# Patient Record
Sex: Female | Born: 1940 | Race: White | Hispanic: No | Smoking: Former smoker
Health system: Southern US, Community
[De-identification: ages and names within clinical notes are randomized; demographics above are authoritative.]

## PROBLEM LIST (undated history)

## (undated) DIAGNOSIS — I10 Essential (primary) hypertension: Secondary | ICD-10-CM

## (undated) DIAGNOSIS — E785 Hyperlipidemia, unspecified: Secondary | ICD-10-CM

## (undated) DIAGNOSIS — M1990 Unspecified osteoarthritis, unspecified site: Secondary | ICD-10-CM

## (undated) DIAGNOSIS — I6529 Occlusion and stenosis of unspecified carotid artery: Secondary | ICD-10-CM

## (undated) DIAGNOSIS — C801 Malignant (primary) neoplasm, unspecified: Secondary | ICD-10-CM

## (undated) DIAGNOSIS — J189 Pneumonia, unspecified organism: Secondary | ICD-10-CM

## (undated) DIAGNOSIS — N189 Chronic kidney disease, unspecified: Secondary | ICD-10-CM

## (undated) DIAGNOSIS — E039 Hypothyroidism, unspecified: Secondary | ICD-10-CM

## (undated) HISTORY — PX: CHOLECYSTECTOMY: SHX55

## (undated) HISTORY — PX: COLON SURGERY: SHX602

## (undated) HISTORY — PX: EYE SURGERY: SHX253

---

## 2009-05-24 ENCOUNTER — Emergency Department (HOSPITAL_COMMUNITY)
Admission: EM | Admit: 2009-05-24 | Discharge: 2009-05-24 | Payer: Self-pay | Source: Home / Self Care | Admitting: Emergency Medicine

## 2010-07-20 LAB — COMPREHENSIVE METABOLIC PANEL
Alkaline Phosphatase: 96 U/L (ref 39–117)
BUN: 28 mg/dL — ABNORMAL HIGH (ref 6–23)
Calcium: 9.3 mg/dL (ref 8.4–10.5)
GFR calc Af Amer: 47 mL/min — ABNORMAL LOW (ref >60)
Glucose, Bld: 122 mg/dL — ABNORMAL HIGH (ref 70–99)
Total Protein: 7 g/dL (ref 6.0–8.3)

## 2010-07-20 LAB — DIFFERENTIAL
Basophils Absolute: 0 10*3/uL (ref 0.0–0.1)
Basophils Relative: 0 % (ref 0–1)
Eosinophils Absolute: 0.1 10*3/uL (ref 0.0–0.7)
Eosinophils Relative: 1 % (ref 0–5)
Monocytes Absolute: 0.8 10*3/uL (ref 0.1–1.0)

## 2010-07-20 LAB — CBC
HCT: 40.5 % (ref 36.0–46.0)
Hemoglobin: 13.7 g/dL (ref 12.0–15.0)
MCHC: 33.8 g/dL (ref 30.0–36.0)
RDW: 12.8 % (ref 11.5–15.5)

## 2020-08-29 ENCOUNTER — Encounter (HOSPITAL_COMMUNITY): Payer: Self-pay

## 2020-08-29 NOTE — Progress Notes (Addendum)
PCP - Clearance Dr. Meredith Mody ,[id number] on chart Cardiologist - 08-19-20 clearance on chart Dr. Collier Salina o'Brien lov 06-20-20 on chart  PPM/ICD -  Device Orders -  Rep Notified -   Chest x-ray -  EKG - 03-24-20 on chart Stress Test -  ECHO -  Cardiac Cath -   Sleep Study -  CPAP -   Fasting Blood Sugar -  Checks Blood Sugar _____ times a day  Blood Thinner Instructions: Aspirin Instructions:81 mg hold 5-7 days  ERAS Protcol - PRE-SURGERY Ensure or G2-   COVID TEST- 09-01-20 Fully vaccinated modern[employer] Activity-Able to walk to mailbox without SOB and walks a flight of stairs without SOB. Owns own business   Anesthesia review: HTN  Patient denies shortness of breath, fever, cough and chest pain at PAT appointment   All instructions explained to the patient, with a verbal understanding of the material. Patient agrees to go over the instructions while at home for a better understanding. Patient also instructed to self quarantine after being tested for COVID-19. The opportunity to ask questions was provided.

## 2020-08-29 NOTE — Patient Instructions (Signed)
DUE TO COVID-19 ONLY ONE VISITOR IS ALLOWED TO COME WITH YOU AND STAY IN THE WAITING ROOM ONLY DURING PRE OP AND PROCEDURE DAY OF SURGERY.   TWO  VISITOR  MAY VISIT WITH YOU AFTER SURGERY IN YOUR PRIVATE ROOM DURING VISITING HOURS ONLY!  YOU NEED TO HAVE A COVID 19 TEST ON_5-2______ @_______ , THIS TEST MUST BE DONE BEFORE SURGERY,  COVID TESTING SITE [address] [place] [address], IT IS ON THE RIGHT GOING OUT [address] APPROXIMATELY  2 MINUTES PAST [employer] ON THE RIGHT. ONCE YOUR COVID TEST IS COMPLETED,  PLEASE BEGIN THE QUARANTINE INSTRUCTIONS AS OUTLINED IN YOUR HANDOUT.                KAROLINA OCEAN  08/29/2020   Your procedure is scheduled on: 09-03-20   Report to [facility] Main  Entrance   Report to admitting at     [place]  AM     Call this number if you have problems the morning of surgery [phone]    Remember: NO SOLID FOOD AFTER MIDNIGHT THE NIGHT PRIOR TO SURGERY. NOTHING BY MOUTH EXCEPT CLEAR LIQUIDS UNTIL    0700 am .   PLEASE FINISH ENSURE DRINK PER SURGEON ORDER  WHICH NEEDS TO BE COMPLETED AT       0700 am the nothing by mouth .    CLEAR LIQUID DIET   Foods Allowed                                                                                  Foods Excluded  Black Coffee and tea, regular and decaf                                          liquids that you cannot  Plain Jell-O any favor except red or purple                                           see through such as: Fruit ices (not with fruit pulp)                                                   milk, soups, orange juice  Iced Popsicles                                                     All solid food Carbonated beverages, regular and diet                                    Cranberry, grape and apple juices  Sports drinks like Gatorade Lightly seasoned clear broth or consume(fat free) Sugar, honey  syrup  _____________________________________________________________________     BRUSH YOUR TEETH MORNING OF SURGERY AND RINSE YOUR MOUTH OUT, NO CHEWING GUM CANDY OR MINTS.     Take these medicines the morning of surgery with A SIP OF WATER: metoprolol, simvastatin, zetia  DO NOT TAKE ANY DIABETIC MEDICATIONS DAY OF YOUR SURGERY                               You may not have any metal on your body including hair pins and              piercings  Do not wear jewelry, make-up, lotions, powders or perfumes, deodorant             Do not wear nail polish on your fingernails.  Do not shave  48 hours prior to surgery.     Do not bring valuables to the hospital. [place].  Contacts, dentures or bridgework may not be worn into surgery. .     Patients discharged the day of surgery will not be allowed to drive home. IF YOU ARE HAVING SURGERY AND GOING HOME THE SAME DAY, YOU MUST HAVE AN ADULT TO DRIVE YOU HOME AND BE WITH YOU FOR 24 HOURS. YOU MAY GO HOME BY TAXI OR UBER OR ORTHERWISE, BUT AN ADULT MUST ACCOMPANY YOU HOME AND STAY WITH YOU FOR 24 HOURS.  Name and phone number of your driver:  Special Instructions: N/A              Please read over the following fact sheets you were given: _____________________________________________________________________             [facility] - Preparing for Surgery Before surgery, you can play an important role.  Because skin is not sterile, your skin needs to be as free of germs as possible.  You can reduce the number of germs on your skin by washing with CHG (chlorahexidine gluconate) soap before surgery.  CHG is an antiseptic cleaner which kills germs and bonds with the skin to continue killing germs even after washing. Please DO NOT use if you have an allergy to CHG or antibacterial soaps.  If your skin becomes reddened/irritated stop using the CHG and inform your nurse when you arrive at Short  Stay. Do not shave (including legs and underarms) for at least 48 hours prior to the first CHG shower.  You may shave your face/neck. Please follow these instructions carefully:  1.  Shower with CHG Soap the night before surgery and the  morning of Surgery.  2.  If you choose to wash your hair, wash your hair first as usual with your  normal  shampoo.  3.  After you shampoo, rinse your hair and body thoroughly to remove the  shampoo.                           4.  Use CHG as you would any other liquid soap.  You can apply chg directly  to the skin and wash                       Gently with a scrungie or clean washcloth.  5.  Apply  the CHG Soap to your body ONLY FROM THE NECK DOWN.   Do not use on face/ open                           Wound or open sores. Avoid contact with eyes, ears mouth and genitals (private parts).                       Wash face,  Genitals (private parts) with your normal soap.             6.  Wash thoroughly, paying special attention to the area where your surgery  will be performed.  7.  Thoroughly rinse your body with warm water from the neck down.  8.  DO NOT shower/wash with your normal soap after using and rinsing off  the CHG Soap.                9.  Pat yourself dry with a clean towel.            10.  Wear clean pajamas.            11.  Place clean sheets on your bed the night of your first shower and do not  sleep with pets. Day of Surgery : Do not apply any lotions/deodorants the morning of surgery.  Please wear clean clothes to the hospital/surgery center.  FAILURE TO FOLLOW THESE INSTRUCTIONS MAY RESULT IN THE CANCELLATION OF YOUR SURGERY PATIENT SIGNATURE_________________________________  NURSE SIGNATURE__________________________________  ________________________________________________________________________   Adam Phenix  An incentive spirometer is a tool that can help keep your lungs clear and active. This tool measures how well you are  filling your lungs with each breath. Taking long deep breaths may help reverse or decrease the chance of developing breathing (pulmonary) problems (especially infection) following:  A long period of time when you are unable to move or be active. BEFORE THE PROCEDURE   If the spirometer includes an indicator to show your best effort, your nurse or respiratory therapist will set it to a desired goal.  If possible, sit up straight or lean slightly forward. Try not to slouch.  Hold the incentive spirometer in an upright position. INSTRUCTIONS FOR USE  1. Sit on the edge of your bed if possible, or sit up as far as you can in bed or on a chair. 2. Hold the incentive spirometer in an upright position. 3. Breathe out normally. 4. Place the mouthpiece in your mouth and seal your lips tightly around it. 5. Breathe in slowly and as deeply as possible, raising the piston or the ball toward the top of the column. 6. Hold your breath for 3-5 seconds or for as long as possible. Allow the piston or ball to fall to the bottom of the column. 7. Remove the mouthpiece from your mouth and breathe out normally. 8. Rest for a few seconds and repeat Steps 1 through 7 at least 10 times every 1-2 hours when you are awake. Take your time and take a few normal breaths between deep breaths. 9. The spirometer may include an indicator to show your best effort. Use the indicator as a goal to work toward during each repetition. 10. After each set of 10 deep breaths, practice coughing to be sure your lungs are clear. If you have an incision (the cut made at the time of surgery), support your incision when coughing by placing a pillow or rolled  up towels firmly against it. Once you are able to get out of bed, walk around indoors and cough well. You may stop using the incentive spirometer when instructed by your caregiver.  RISKS AND COMPLICATIONS  Take your time so you do not get dizzy or light-headed.  If you are in pain,  you may need to take or ask for pain medication before doing incentive spirometry. It is harder to take a deep breath if you are having pain. AFTER USE  Rest and breathe slowly and easily.  It can be helpful to keep track of a log of your progress. Your caregiver can provide you with a simple table to help with this. If you are using the spirometer at home, follow these instructions: [place] IF:   You are having difficultly using the spirometer.  You have trouble using the spirometer as often as instructed.  Your pain medication is not giving enough relief while using the spirometer.  You develop fever of 100.5 F (38.1 C) or higher. SEEK IMMEDIATE MEDICAL CARE IF:   You cough up bloody sputum that had not been present before.  You develop fever of 102 F (38.9 C) or greater.  You develop worsening pain at or near the incision site. MAKE SURE YOU:   Understand these instructions.  Will watch your condition.  Will get help right away if you are not doing well or get worse. Document Released: 08/30/2006 Document Revised: 07/12/2011 Document Reviewed: 10/31/2006 [facility] Patient Information 2014 [place], [place].   ________________________________________________________________________

## 2020-09-01 ENCOUNTER — Encounter (HOSPITAL_COMMUNITY): Payer: Self-pay

## 2020-09-01 ENCOUNTER — Other Ambulatory Visit: Payer: Self-pay

## 2020-09-01 ENCOUNTER — Other Ambulatory Visit (HOSPITAL_COMMUNITY)
Admission: RE | Admit: 2020-09-01 | Discharge: 2020-09-01 | Disposition: A | Discharge status: Home / Self Care | Payer: Medicare PPO | Source: Ambulatory Visit | Attending: Obstetrics and Gynecology | Admitting: Obstetrics and Gynecology

## 2020-09-01 ENCOUNTER — Encounter (HOSPITAL_COMMUNITY)
Admission: RE | Admit: 2020-09-01 | Discharge: 2020-09-01 | Disposition: A | Discharge status: Home / Self Care | Payer: Medicare PPO | Source: Ambulatory Visit | Attending: Orthopedic Surgery | Admitting: Orthopedic Surgery

## 2020-09-01 DIAGNOSIS — Z20822 Contact with and (suspected) exposure to covid-19: Secondary | ICD-10-CM | POA: Insufficient documentation

## 2020-09-01 DIAGNOSIS — Z01812 Encounter for preprocedural laboratory examination: Secondary | ICD-10-CM | POA: Diagnosis not present

## 2020-09-01 HISTORY — DX: Hyperlipidemia, unspecified: E78.5

## 2020-09-01 HISTORY — DX: Malignant (primary) neoplasm, unspecified: C80.1

## 2020-09-01 HISTORY — DX: Essential (primary) hypertension: I10

## 2020-09-01 HISTORY — DX: Hypothyroidism, unspecified: E03.9

## 2020-09-01 HISTORY — DX: Pneumonia, unspecified organism: J18.9

## 2020-09-01 HISTORY — DX: Unspecified osteoarthritis, unspecified site: M19.90

## 2020-09-01 HISTORY — DX: Occlusion and stenosis of unspecified carotid artery: I65.29

## 2020-09-01 HISTORY — DX: Chronic kidney disease, unspecified: N18.9

## 2020-09-01 LAB — COMPREHENSIVE METABOLIC PANEL
ALT: 13 U/L (ref 0–44)
AST: 21 U/L (ref 15–41)
Albumin: 3.8 g/dL (ref 3.5–5.0)
Alkaline Phosphatase: 77 U/L (ref 38–126)
Anion gap: 6 (ref 5–15)
BUN: 33 mg/dL — ABNORMAL HIGH (ref 8–23)
CO2: 25 mmol/L (ref 22–32)
Calcium: 9.5 mg/dL (ref 8.9–10.3)
Chloride: 108 mmol/L (ref 98–111)
Creatinine, Ser: 1.25 mg/dL — ABNORMAL HIGH (ref 0.44–1.00)
GFR, Estimated: 43 mL/min — ABNORMAL LOW (ref >60)
Glucose, Bld: 97 mg/dL (ref 70–99)
Potassium: 4.7 mmol/L (ref 3.5–5.1)
Sodium: 139 mmol/L (ref 135–145)
Total Bilirubin: 0.7 mg/dL (ref 0.3–1.2)
Total Protein: 6.6 g/dL (ref 6.5–8.1)

## 2020-09-01 LAB — CBC
HCT: 38.5 % (ref 36.0–46.0)
Hemoglobin: 12.4 g/dL (ref 12.0–15.0)
MCH: 29.7 pg (ref 26.0–34.0)
MCHC: 32.2 g/dL (ref 30.0–36.0)
MCV: 92.1 fL (ref 80.0–100.0)
Platelets: 228 10*3/uL (ref 150–400)
RBC: 4.18 MIL/uL (ref 3.87–5.11)
RDW: 12.9 % (ref 11.5–15.5)
WBC: 6.1 10*3/uL (ref 4.0–10.5)
nRBC: 0 % (ref 0.0–0.2)

## 2020-09-01 LAB — PROTIME-INR
INR: 0.9 (ref 0.8–1.2)
Prothrombin Time: 12.5 seconds (ref 11.4–15.2)

## 2020-09-01 LAB — SURGICAL PCR SCREEN
MRSA, PCR: NEGATIVE
Staphylococcus aureus: NEGATIVE

## 2020-09-02 LAB — SARS CORONAVIRUS 2 (TAT 6-24 HRS): SARS Coronavirus 2: NEGATIVE

## 2020-09-02 NOTE — Anesthesia Preprocedure Evaluation (Addendum)
Anesthesia Evaluation  Patient identified by MRN, date of birth, ID band Patient awake    Reviewed: Allergy & Precautions, H&P , NPO status , Patient's Chart, lab work & pertinent test results, reviewed documented beta blocker date and time   Airway Mallampati: III  TM Distance: >3 FB Neck ROM: Full    Dental no notable dental hx. (+) Edentulous Upper, Edentulous Lower, Dental Advisory Given   Pulmonary neg pulmonary ROS, former smoker,    Pulmonary exam normal breath sounds clear to auscultation       Cardiovascular Exercise Tolerance: Good hypertension, Pt. on medications and Pt. on home beta blockers  Rhythm:Regular Rate:Normal     Neuro/Psych negative neurological ROS  negative psych ROS   GI/Hepatic negative GI ROS, Neg liver ROS,   Endo/Other  Hypothyroidism   Renal/GU Renal InsufficiencyRenal disease  negative genitourinary   Musculoskeletal  (+) Arthritis , Osteoarthritis,    Abdominal   Peds  Hematology negative hematology ROS (+)   Anesthesia Other Findings   Reproductive/Obstetrics negative OB ROS                            Anesthesia Physical Anesthesia Plan  ASA: III  Anesthesia Plan: Spinal   Post-op Pain Management:    Induction: Intravenous  PONV Risk Score and Plan: 3 and Propofol infusion, Ondansetron and Dexamethasone  Airway Management Planned: Simple Face Mask  Additional Equipment:   Intra-op Plan:   Post-operative Plan:   Informed Consent: I have reviewed the patients History and Physical, chart, labs and discussed the procedure including the risks, benefits and alternatives for the proposed anesthesia with the patient or authorized representative who has indicated his/her understanding and acceptance.     Dental advisory given  Plan Discussed with: CRNA  Anesthesia Plan Comments:        Anesthesia Quick Evaluation

## 2020-09-02 NOTE — H&P (Signed)
TOTAL HIP ADMISSION H&P  Patient is admitted for right total hip arthroplasty.  Subjective:  Chief Complaint: Right hip pain  HPI: Veronica Logan, 80 y.o. female, has a history of pain and functional disability in the right hip due to arthritis and patient has failed non-surgical conservative treatments for greater than 12 weeks to include NSAID's and/or analgesics and activity modification. Onset of symptoms was gradual, starting several years ago with gradually worsening course since that time. The patient noted no past surgery on the right hip. Patient currently rates pain in the right hip at 5 out of 10 with activity. Patient has worsening of pain with activity and weight bearing and pain that interfers with activities of daily living. Patient has evidence of periarticular osteophytes and joint space narrowing by imaging studies. This condition presents safety issues increasing the risk of falls.  There is no current active infection.  There are no problems to display for this patient.   Past Medical History:  Diagnosis Date  . Arthritis   . Cancer ([place])    colon  . Carotid stenosis   . Chronic kidney disease    stage 3  . Hyperlipidemia   . Hypertension   . Hypothyroidism   . Pneumonia     Past Surgical History:  Procedure Laterality Date  . CHOLECYSTECTOMY    . COLON SURGERY     colon resection  . EYE SURGERY     cataract bil    Prior to Admission medications   Medication Sig Start Date End Date Taking? Authorizing Provider  aspirin EC 81 MG tablet Take 81 mg by mouth daily. Swallow whole.   Yes [provider]  ezetimibe (ZETIA) 10 MG tablet Take 10 mg by mouth daily.   Yes [provider]  levothyroxine (SYNTHROID) 88 MCG tablet Take 88 mcg by mouth daily.   Yes [provider]  lisinopril-hydrochlorothiazide (ZESTORETIC) 20-12.5 MG tablet Take 1 tablet by mouth daily.   Yes [provider]  metoprolol tartrate (LOPRESSOR) 25  MG tablet Take 25 mg by mouth 2 (two) times daily.   Yes [provider]  Multiple Vitamins-Minerals (OCUVITE ADULT 50+ PO) Take 1 tablet by mouth daily.   Yes [provider]  simvastatin (ZOCOR) 40 MG tablet Take 40 mg by mouth daily.   Yes [provider]    Allergies  Allergen Reactions  . Aleve [Naproxen]     Breaks mouth out     Social History   Socioeconomic History  . Marital status: Widowed    Spouse name: Not on file  . Number of children: Not on file  . Years of education: Not on file  . Highest education level: Not on file  Occupational History  . Not on file  Tobacco Use  . Smoking status: Former Smoker    Years: 10.00  . Smokeless tobacco: Never Used  . Tobacco comment: qiut over 50 years ago  Vaping Use  . Vaping Use: Never used  Substance and Sexual Activity  . Alcohol use: Not Currently  . Drug use: Never  . Sexual activity: Not Currently  Other Topics Concern  . Not on file  Social History Narrative  . Not on file   Social Determinants of Health   Financial Resource Strain: Not on file  Food Insecurity: Not on file  Transportation Needs: Not on file  Physical Activity: Not on file  Stress: Not on file  Social Connections: Not on file  Intimate Partner Violence:  Not on file    Tobacco Use: Medium Risk  . Smoking Tobacco Use: Former Smoker  . Smokeless Tobacco Use: Never Used   Social History   Substance and Sexual Activity  Alcohol Use Not Currently    No family history on file.   ROS: Constitutional: no fever, no chills, no night sweats, no significant weight loss Cardiovascular: no chest pain, no palpitations Respiratory: no cough, no shortness of breath, No COPD Gastrointestinal: no vomiting, no nausea Musculoskeletal: no swelling in Joints, Joint Pain Neurologic: no numbness, no tingling, no difficulty with balance   Objective:  Physical Exam: Well nourished and well developed.  General: Alert  and oriented x3, cooperative and pleasant, no acute distress.  Head: normocephalic, atraumatic, neck supple.  Eyes: EOMI.  Respiratory: breath sounds clear in all fields, no wheezing, rales, or rhonchi. Cardiovascular: Regular rate and rhythm, no murmurs, gallops or rubs.  Abdomen: non-tender to palpation and soft, normoactive bowel sounds. Musculoskeletal:  The patient has an antalgic gait and ambulates with a walker.    Right Hip Exam:  The range of motion: Flexion to 120 degrees, Internal Rotation to 20 degrees, External Rotation to 30 degrees, and abduction to 30 degrees with slight discomfort on rotational movements.  There is positive tenderness over the greater trochanteric bursa.  Palpation reproduces some of the pain that she is experiencing.  There is no pain on provocative testing of the hip.    Left Hip Exam:  The range of motion: Normal without discomfort.  There is no tenderness over the greater trochanteric bursa.    The patient's sensation and motor function are intact in their lower extremities. Their distal pulses are 2+. The bilateral calves are soft and non-tender.    Vital signs in last 24 hours:    Imaging Review  AP pelvis and lateral of the right hip dated 07/18/2020 demonstrate no evidence of any significant arthritis, acute or chronic bony abnormalities.   Assessment/Plan:  End stage arthritis, right hip  The patient history, physical examination, clinical judgement of the provider and imaging studies are consistent with end stage degenerative joint disease of the right hip and total hip arthroplasty is deemed medically necessary. The treatment options including medical management, injection therapy, arthroscopy and arthroplasty were discussed at length. The risks and benefits of total hip arthroplasty were presented and reviewed. The risks due to aseptic loosening, infection, stiffness, dislocation/subluxation, thromboembolic complications and other  imponderables were discussed. The patient acknowledged the explanation, agreed to proceed with the plan and consent was signed. Patient is being admitted for inpatient treatment for surgery, pain control, PT, OT, prophylactic antibiotics, VTE prophylaxis, progressive ambulation and ADLs and discharge planning.The patient is planning to be discharged home.   Patient's anticipated LOS is less than 2 midnights, meeting these requirements: - Younger than 26 - Lives within 1 hour of care - Has a competent adult at home to recover with post-op recover - NO history of  - Chronic pain requiring opiods  - Diabetes  - Coronary Artery Disease  - Heart failure  - Heart attack  - Stroke  - DVT/VTE  - Cardiac arrhythmia  - Respiratory Failure/COPD  - Renal failure  - Anemia  - Advanced Liver disease      Therapy Plans: HEP Disposition: Home with daughter Planned DVT Prophylaxis: Xarelto (Hx of colon cancer) DME Needed: None PCP: Dr. Caryl Bis (clearance received) Cardiologist: Dr. Werner Lean -- clearance received -- recommends stopping Aspirin 7 days prior.  TXA: IV Allergies:  Aleve - no other known drug allergies Anesthesia Concerns: None BMI: 26.8 Last HgbA1c: N/A  Pharmacy: [place] [place]  - Patient was instructed on what medications to stop prior to surgery. - Follow-up visit in 2 weeks with Dr. Wynelle Link - Begin physical therapy following surgery - Pre-operative lab work as pre-surgical testing - Prescriptions will be provided in hospital at time of discharge  Fenton Foy, [facility], PA-C Orthopedic Surgery [facility] [place]

## 2020-09-03 ENCOUNTER — Ambulatory Visit (HOSPITAL_COMMUNITY): Payer: Medicare PPO

## 2020-09-03 ENCOUNTER — Other Ambulatory Visit: Payer: Self-pay

## 2020-09-03 ENCOUNTER — Observation Stay (HOSPITAL_COMMUNITY): Payer: Medicare PPO

## 2020-09-03 ENCOUNTER — Encounter (HOSPITAL_COMMUNITY)
Admission: RE | Disposition: A | Discharge status: Home / Self Care | Payer: Self-pay | Source: Home / Self Care | Attending: Orthopedic Surgery

## 2020-09-03 ENCOUNTER — Encounter (HOSPITAL_COMMUNITY): Payer: Self-pay | Admitting: Orthopedic Surgery

## 2020-09-03 ENCOUNTER — Ambulatory Visit (HOSPITAL_COMMUNITY): Payer: Medicare PPO | Admitting: Anesthesiology

## 2020-09-03 ENCOUNTER — Observation Stay (HOSPITAL_COMMUNITY)
Admission: RE | Admit: 2020-09-03 | Discharge: 2020-09-04 | Disposition: A | Discharge status: Home / Self Care | Payer: Medicare PPO | Attending: Orthopedic Surgery | Admitting: Orthopedic Surgery

## 2020-09-03 DIAGNOSIS — I129 Hypertensive chronic kidney disease with stage 1 through stage 4 chronic kidney disease, or unspecified chronic kidney disease: Secondary | ICD-10-CM | POA: Insufficient documentation

## 2020-09-03 DIAGNOSIS — M169 Osteoarthritis of hip, unspecified: Secondary | ICD-10-CM | POA: Diagnosis present

## 2020-09-03 DIAGNOSIS — Z7982 Long term (current) use of aspirin: Secondary | ICD-10-CM | POA: Insufficient documentation

## 2020-09-03 DIAGNOSIS — Z79899 Other long term (current) drug therapy: Secondary | ICD-10-CM | POA: Diagnosis not present

## 2020-09-03 DIAGNOSIS — M1611 Unilateral primary osteoarthritis, right hip: Secondary | ICD-10-CM | POA: Diagnosis not present

## 2020-09-03 DIAGNOSIS — Z87891 Personal history of nicotine dependence: Secondary | ICD-10-CM | POA: Insufficient documentation

## 2020-09-03 DIAGNOSIS — Z85038 Personal history of other malignant neoplasm of large intestine: Secondary | ICD-10-CM | POA: Insufficient documentation

## 2020-09-03 DIAGNOSIS — Z96649 Presence of unspecified artificial hip joint: Secondary | ICD-10-CM

## 2020-09-03 DIAGNOSIS — E039 Hypothyroidism, unspecified: Secondary | ICD-10-CM | POA: Insufficient documentation

## 2020-09-03 DIAGNOSIS — N183 Chronic kidney disease, stage 3 unspecified: Secondary | ICD-10-CM | POA: Insufficient documentation

## 2020-09-03 DIAGNOSIS — Z419 Encounter for procedure for purposes other than remedying health state, unspecified: Secondary | ICD-10-CM

## 2020-09-03 HISTORY — PX: TOTAL HIP ARTHROPLASTY: SHX124

## 2020-09-03 LAB — TYPE AND SCREEN
ABO/RH(D): O POS
Antibody Screen: NEGATIVE

## 2020-09-03 LAB — ABO/RH: ABO/RH(D): O POS

## 2020-09-03 SURGERY — ARTHROPLASTY, HIP, TOTAL, ANTERIOR APPROACH
Anesthesia: Spinal | Site: Hip | Laterality: Right

## 2020-09-03 MED ORDER — HYDROCHLOROTHIAZIDE 12.5 MG PO CAPS
12.5000 mg | ORAL_CAPSULE | Freq: Every day | ORAL | Status: DC
  Filled 2020-09-03: qty 1

## 2020-09-03 MED ORDER — ONDANSETRON HCL 4 MG/2ML IJ SOLN
INTRAMUSCULAR | Status: DC | PRN
  Administered 2020-09-03: 4 mg via INTRAVENOUS

## 2020-09-03 MED ORDER — PROPOFOL 500 MG/50ML IV EMUL
INTRAVENOUS | Status: DC | PRN
  Administered 2020-09-03: 50 ug/kg/min via INTRAVENOUS
  Administered 2020-09-03: 30 mg via INTRAVENOUS

## 2020-09-03 MED ORDER — CEFAZOLIN SODIUM-DEXTROSE 2-4 GM/100ML-% IV SOLN
2.0000 g | INTRAVENOUS | Status: AC
  Administered 2020-09-03: 2 g via INTRAVENOUS
  Filled 2020-09-03: qty 100

## 2020-09-03 MED ORDER — BUPIVACAINE-EPINEPHRINE (PF) 0.25% -1:200000 IJ SOLN
INTRAMUSCULAR | Status: DC | PRN
Start: 2020-09-03 — End: 2020-09-03
  Administered 2020-09-03: 30 mL

## 2020-09-03 MED ORDER — LACTATED RINGERS IV SOLN: INTRAVENOUS | Status: DC

## 2020-09-03 MED ORDER — METOCLOPRAMIDE HCL 5 MG PO TABS: 5.0000 mg | ORAL_TABLET | Freq: Three times a day (TID) | ORAL | Status: DC | PRN

## 2020-09-03 MED ORDER — ACETAMINOPHEN 10 MG/ML IV SOLN
1000.0000 mg | Freq: Four times a day (QID) | INTRAVENOUS | Status: DC
  Administered 2020-09-03: 1000 mg via INTRAVENOUS
  Filled 2020-09-03: qty 100

## 2020-09-03 MED ORDER — PROPOFOL 1000 MG/100ML IV EMUL
INTRAVENOUS | Status: AC
  Filled 2020-09-03: qty 100

## 2020-09-03 MED ORDER — POVIDONE-IODINE 10 % EX SWAB
2.0000 "application " | Freq: Once | CUTANEOUS | Status: AC
  Administered 2020-09-03: 2 via TOPICAL

## 2020-09-03 MED ORDER — BUPIVACAINE-EPINEPHRINE (PF) 0.25% -1:200000 IJ SOLN
INTRAMUSCULAR | Status: AC
  Filled 2020-09-03: qty 30

## 2020-09-03 MED ORDER — FENTANYL CITRATE (PF) 100 MCG/2ML IJ SOLN: 25.0000 ug | INTRAMUSCULAR | Status: DC | PRN

## 2020-09-03 MED ORDER — LISINOPRIL 20 MG PO TABS: 20.0000 mg | ORAL_TABLET | Freq: Every day | ORAL | Status: DC

## 2020-09-03 MED ORDER — METOCLOPRAMIDE HCL 5 MG/ML IJ SOLN: 5.0000 mg | Freq: Three times a day (TID) | INTRAMUSCULAR | Status: DC | PRN

## 2020-09-03 MED ORDER — TRANEXAMIC ACID-NACL 1000-0.7 MG/100ML-% IV SOLN
1000.0000 mg | INTRAVENOUS | Status: AC
  Administered 2020-09-03: 1000 mg via INTRAVENOUS
  Filled 2020-09-03: qty 100

## 2020-09-03 MED ORDER — METHOCARBAMOL 500 MG PO TABS
500.0000 mg | ORAL_TABLET | Freq: Four times a day (QID) | ORAL | Status: DC | PRN
  Administered 2020-09-03 (×2): 500 mg via ORAL
  Filled 2020-09-03 (×2): qty 1

## 2020-09-03 MED ORDER — RIVAROXABAN 10 MG PO TABS
10.0000 mg | ORAL_TABLET | Freq: Every day | ORAL | Status: DC
  Administered 2020-09-04: 10 mg via ORAL
  Filled 2020-09-03: qty 1

## 2020-09-03 MED ORDER — 0.9 % SODIUM CHLORIDE (POUR BTL) OPTIME
TOPICAL | Status: DC | PRN
  Administered 2020-09-03: 1000 mL

## 2020-09-03 MED ORDER — ACETAMINOPHEN 325 MG PO TABS: 325.0000 mg | ORAL_TABLET | Freq: Four times a day (QID) | ORAL | Status: DC | PRN

## 2020-09-03 MED ORDER — BUPIVACAINE IN DEXTROSE 0.75-8.25 % IT SOLN
INTRATHECAL | Status: DC | PRN
  Administered 2020-09-03: 1.6 mL via INTRATHECAL

## 2020-09-03 MED ORDER — DEXAMETHASONE SODIUM PHOSPHATE 10 MG/ML IJ SOLN
INTRAMUSCULAR | Status: DC | PRN
  Administered 2020-09-03: 10 mg via INTRAVENOUS

## 2020-09-03 MED ORDER — MORPHINE SULFATE (PF) 2 MG/ML IV SOLN: 0.5000 mg | INTRAVENOUS | Status: DC | PRN

## 2020-09-03 MED ORDER — TRAMADOL HCL 50 MG PO TABS: 50.0000 mg | ORAL_TABLET | Freq: Four times a day (QID) | ORAL | Status: DC | PRN

## 2020-09-03 MED ORDER — MAGNESIUM CITRATE PO SOLN: 1.0000 | Freq: Once | ORAL | Status: DC | PRN

## 2020-09-03 MED ORDER — HYDROCODONE-ACETAMINOPHEN 5-325 MG PO TABS
1.0000 | ORAL_TABLET | ORAL | Status: DC | PRN
  Administered 2020-09-03 – 2020-09-04 (×5): 2 via ORAL
  Administered 2020-09-04: 1 via ORAL
  Filled 2020-09-03 (×3): qty 2
  Filled 2020-09-03: qty 1
  Filled 2020-09-03 (×2): qty 2
  Filled 2020-09-03: qty 1

## 2020-09-03 MED ORDER — DEXAMETHASONE SODIUM PHOSPHATE 10 MG/ML IJ SOLN: 8.0000 mg | Freq: Once | INTRAMUSCULAR | Status: DC

## 2020-09-03 MED ORDER — METHOCARBAMOL 500 MG IVPB - SIMPLE MED
500.0000 mg | Freq: Four times a day (QID) | INTRAVENOUS | Status: DC | PRN
  Filled 2020-09-03: qty 50

## 2020-09-03 MED ORDER — LEVOTHYROXINE SODIUM 88 MCG PO TABS
88.0000 ug | ORAL_TABLET | Freq: Every day | ORAL | Status: DC
  Administered 2020-09-04: 88 ug via ORAL
  Filled 2020-09-03: qty 1

## 2020-09-03 MED ORDER — SODIUM CHLORIDE 0.9 % IV SOLN: INTRAVENOUS | Status: DC

## 2020-09-03 MED ORDER — POLYETHYLENE GLYCOL 3350 17 G PO PACK: 17.0000 g | PACK | Freq: Every day | ORAL | Status: DC | PRN

## 2020-09-03 MED ORDER — ONDANSETRON HCL 4 MG/2ML IJ SOLN: 4.0000 mg | Freq: Four times a day (QID) | INTRAMUSCULAR | Status: DC | PRN

## 2020-09-03 MED ORDER — EPHEDRINE SULFATE-NACL 50-0.9 MG/10ML-% IV SOSY
PREFILLED_SYRINGE | INTRAVENOUS | Status: DC | PRN
  Administered 2020-09-03: 10 mg via INTRAVENOUS

## 2020-09-03 MED ORDER — PHENOL 1.4 % MT LIQD: 1.0000 | OROMUCOSAL | Status: DC | PRN

## 2020-09-03 MED ORDER — ORAL CARE MOUTH RINSE: 15.0000 mL | Freq: Once | OROMUCOSAL | Status: AC

## 2020-09-03 MED ORDER — CHLORHEXIDINE GLUCONATE 0.12 % MT SOLN
15.0000 mL | Freq: Once | OROMUCOSAL | Status: AC
  Administered 2020-09-03: 15 mL via OROMUCOSAL

## 2020-09-03 MED ORDER — MENTHOL 3 MG MT LOZG: 1.0000 | LOZENGE | OROMUCOSAL | Status: DC | PRN

## 2020-09-03 MED ORDER — METOPROLOL TARTRATE 25 MG PO TABS
25.0000 mg | ORAL_TABLET | Freq: Two times a day (BID) | ORAL | Status: DC
  Administered 2020-09-03 – 2020-09-04 (×2): 25 mg via ORAL
  Filled 2020-09-03 (×3): qty 1

## 2020-09-03 MED ORDER — CEFAZOLIN SODIUM-DEXTROSE 2-4 GM/100ML-% IV SOLN
2.0000 g | Freq: Four times a day (QID) | INTRAVENOUS | Status: AC
  Administered 2020-09-03 (×2): 2 g via INTRAVENOUS
  Filled 2020-09-03 (×3): qty 100

## 2020-09-03 MED ORDER — DEXAMETHASONE SODIUM PHOSPHATE 10 MG/ML IJ SOLN
10.0000 mg | Freq: Once | INTRAMUSCULAR | Status: AC
  Administered 2020-09-04: 10 mg via INTRAVENOUS
  Filled 2020-09-03: qty 1

## 2020-09-03 MED ORDER — LISINOPRIL-HYDROCHLOROTHIAZIDE 20-12.5 MG PO TABS: 1.0000 | ORAL_TABLET | Freq: Every day | ORAL | Status: DC

## 2020-09-03 MED ORDER — BISACODYL 10 MG RE SUPP: 10.0000 mg | Freq: Every day | RECTAL | Status: DC | PRN

## 2020-09-03 MED ORDER — EZETIMIBE 10 MG PO TABS
10.0000 mg | ORAL_TABLET | Freq: Every day | ORAL | Status: DC
  Administered 2020-09-04: 10 mg via ORAL
  Filled 2020-09-03: qty 1

## 2020-09-03 MED ORDER — DOCUSATE SODIUM 100 MG PO CAPS
100.0000 mg | ORAL_CAPSULE | Freq: Two times a day (BID) | ORAL | Status: DC
  Administered 2020-09-03 – 2020-09-04 (×3): 100 mg via ORAL
  Filled 2020-09-03 (×3): qty 1

## 2020-09-03 MED ORDER — WATER FOR IRRIGATION, STERILE IR SOLN
Status: DC | PRN
  Administered 2020-09-03: 2000 mL

## 2020-09-03 MED ORDER — SIMVASTATIN 40 MG PO TABS
40.0000 mg | ORAL_TABLET | Freq: Every day | ORAL | Status: DC
  Administered 2020-09-04: 40 mg via ORAL
  Filled 2020-09-03: qty 1

## 2020-09-03 MED ORDER — ONDANSETRON HCL 4 MG PO TABS: 4.0000 mg | ORAL_TABLET | Freq: Four times a day (QID) | ORAL | Status: DC | PRN

## 2020-09-03 SURGICAL SUPPLY — 41 items
BAG DECANTER FOR FLEXI CONT (MISCELLANEOUS) IMPLANT
BAG ZIPLOCK 12X15 (MISCELLANEOUS) IMPLANT
BALL HIP ARTICU 28 +5 (Hips) ×1 IMPLANT
BLADE SAG 18X100X1.27 (BLADE) ×2 IMPLANT
COVER PERINEAL POST (MISCELLANEOUS) ×2 IMPLANT
COVER SURGICAL LIGHT HANDLE (MISCELLANEOUS) ×2 IMPLANT
COVER WAND RF STERILE (DRAPES) IMPLANT
CUP ACETBLR 48 OD SECTOR II (Hips) ×2 IMPLANT
DECANTER SPIKE VIAL GLASS SM (MISCELLANEOUS) ×2 IMPLANT
DRAPE STERI IOBAN 125X83 (DRAPES) ×2 IMPLANT
DRAPE U-SHAPE 47X51 STRL (DRAPES) ×4 IMPLANT
DRSG AQUACEL AG ADV 3.5X10 (GAUZE/BANDAGES/DRESSINGS) ×2 IMPLANT
DURAPREP 26ML APPLICATOR (WOUND CARE) ×2 IMPLANT
ELECT REM PT RETURN 15FT ADLT (MISCELLANEOUS) ×2 IMPLANT
GLOVE SRG 8 PF TXTR STRL LF DI (GLOVE) ×1 IMPLANT
GLOVE SURG ENC MOIS LTX SZ6.5 (GLOVE) ×2 IMPLANT
GLOVE SURG ENC MOIS LTX SZ7 (GLOVE) ×2 IMPLANT
GLOVE SURG ENC MOIS LTX SZ8 (GLOVE) ×4 IMPLANT
GLOVE SURG UNDER POLY LF SZ7 (GLOVE) ×2 IMPLANT
GLOVE SURG UNDER POLY LF SZ8 (GLOVE) ×1
GLOVE SURG UNDER POLY LF SZ8.5 (GLOVE) IMPLANT
GOWN STRL REUS W/TWL LRG LVL3 (GOWN DISPOSABLE) ×4 IMPLANT
GOWN STRL REUS W/TWL XL LVL3 (GOWN DISPOSABLE) IMPLANT
HIP BALL ARTICU 28 +5 (Hips) ×2 IMPLANT
HOLDER FOLEY CATH W/STRAP (MISCELLANEOUS) ×2 IMPLANT
KIT TURNOVER KIT A (KITS) ×2 IMPLANT
LINER MARATHON 28 48 (Hips) ×2 IMPLANT
MANIFOLD NEPTUNE II (INSTRUMENTS) ×2 IMPLANT
PACK ANTERIOR HIP CUSTOM (KITS) ×2 IMPLANT
PENCIL SMOKE EVACUATOR COATED (MISCELLANEOUS) ×2 IMPLANT
STEM FEM ACTIS HIGH SZ7 (Stem) ×2 IMPLANT
STRIP CLOSURE SKIN 1/2X4 (GAUZE/BANDAGES/DRESSINGS) ×4 IMPLANT
SUT ETHIBOND NAB CT1 #1 30IN (SUTURE) ×2 IMPLANT
SUT MNCRL AB 4-0 PS2 18 (SUTURE) ×2 IMPLANT
SUT STRATAFIX 0 PDS 27 VIOLET (SUTURE) ×2
SUT VIC AB 2-0 CT1 27 (SUTURE) ×2
SUT VIC AB 2-0 CT1 TAPERPNT 27 (SUTURE) ×2 IMPLANT
SUTURE STRATFX 0 PDS 27 VIOLET (SUTURE) ×1 IMPLANT
SYR 50ML LL SCALE MARK (SYRINGE) IMPLANT
TRAY FOLEY MTR SLVR 16FR STAT (SET/KITS/TRAYS/PACK) ×2 IMPLANT
TUBE SUCTION HIGH CAP CLEAR NV (SUCTIONS) ×2 IMPLANT

## 2020-09-03 NOTE — Anesthesia Procedure Notes (Signed)
Spinal  Patient location during procedure: OR Start time: 09/03/2020 9:58 AM Reason for block: surgical anesthesia Staffing Performed: resident/CRNA  Anesthesiologist: Roderic Palau, MD Resident/CRNA: Gerald Leitz, CRNA Preanesthetic Checklist Completed: patient identified, IV checked, site marked, risks and benefits discussed, surgical consent, monitors and equipment checked, pre-op evaluation and timeout performed Spinal Block Patient position: sitting Prep: DuraPrep Patient monitoring: heart rate, continuous pulse ox, blood pressure and cardiac monitor Approach: midline Location: L3-4 Injection technique: single-shot Needle Needle type: Introducer and Pencan  Needle gauge: 24 G Needle length: 9 cm Assessment Sensory level: T4 Events: CSF return Additional Notes Negative paresthesia. Negative blood return. Positive free-flowing CSF. Expiration date of kit checked and confirmed. Patient tolerated procedure well, without complications.

## 2020-09-03 NOTE — Discharge Instructions (Addendum)
Gaynelle Arabian, MD Total Joint Specialist [facility] [place] [address], [address] [place],  [address] [phone]  ANTERIOR APPROACH TOTAL HIP REPLACEMENT POSTOPERATIVE DIRECTIONS     Hip Rehabilitation, Guidelines Following Surgery  The results of a hip operation are greatly improved after range of motion and muscle strengthening exercises. Follow all safety measures which are given to protect your hip. If any of these exercises cause increased pain or swelling in your joint, decrease the amount until you are comfortable again. Then slowly increase the exercises. Call your caregiver if you have problems or questions.   BLOOD CLOT PREVENTION . Take a 10 mg Xarelto once a day for three weeks following surgery. Then resume one 81 mg aspirin once a day. . You may resume your vitamins/supplements once you have discontinued the Xarelto. . Do not take any NSAIDs (Advil, Aleve, Ibuprofen, Meloxicam, etc.) until you have discontinued the Xarelto.   HOME CARE INSTRUCTIONS  . Remove items at home which could result in a fall. This includes throw rugs or furniture in walking pathways.   ICE to the affected hip as frequently as 20-30 minutes an hour and then as needed for pain and swelling. Continue to use ice on the hip for pain and swelling from surgery. You may notice swelling that will progress down to the foot and ankle. This is normal after surgery. Elevate the leg when you are not up walking on it.    Continue to use the breathing machine which will help keep your temperature down.  It is common for your temperature to cycle up and down following surgery, especially at night when you are not up moving around and exerting yourself.  The breathing machine keeps your lungs expanded and your temperature down.  DIET You may resume your previous home diet once your are discharged from the hospital.  DRESSING / WOUND CARE / SHOWERING . You have an adhesive waterproof bandage  over the incision. Leave this in place until your first follow-up appointment. Once you remove this you will not need to place another bandage.  . You may begin showering 3 days following surgery, but do not submerge the incision under water.  ACTIVITY . For the first 3-5 days, it is important to rest and keep the operative leg elevated. You should, as a general rule, rest for 50 minutes and walk/stretch for 10 minutes per hour. After 5 days, you may slowly increase activity as tolerated.  Marland Kitchen Perform the exercises you were provided twice a day for about 15-20 minutes each session. Begin these 2 days following surgery. . Walk with your walker as instructed. Use the walker until you are comfortable transitioning to a cane. Walk with the cane in the opposite hand of the operative leg. You may discontinue the cane once you are comfortable and walking steadily. . Avoid periods of inactivity such as sitting longer than an hour when not asleep. This helps prevent blood clots.  . Do not drive a car for 6 weeks or until released by your surgeon.  . Do not drive while taking narcotics.  TED HOSE STOCKINGS Wear the elastic stockings on both legs for three weeks following surgery during the day. You may remove them at night while sleeping.  WEIGHT BEARING Weight bearing as tolerated with assist device (walker, cane, etc) as directed, use it as long as suggested by your surgeon or therapist, typically at least 4-6 weeks.  POSTOPERATIVE CONSTIPATION PROTOCOL Constipation - defined medically as fewer than three  stools per week and severe constipation as less than one stool per week.  One of the most common issues patients have following surgery is constipation.  Even if you have a regular bowel pattern at home, your normal regimen is likely to be disrupted due to multiple reasons following surgery.  Combination of anesthesia, postoperative narcotics, change in appetite and fluid intake all can affect your  bowels.  In order to avoid complications following surgery, here are some recommendations in order to help you during your recovery period.  . Colace (docusate) - Pick up an over-the-counter form of Colace or another stool softener and take twice a day as long as you are requiring postoperative pain medications.  Take with a full glass of water daily.  If you experience loose stools or diarrhea, hold the colace until you stool forms back up.  If your symptoms do not get better within 1 week or if they get worse, check with your doctor. . Dulcolax (bisacodyl) - Pick up over-the-counter and take as directed by the product packaging as needed to assist with the movement of your bowels.  Take with a full glass of water.  Use this product as needed if not relieved by Colace only.  . MiraLax (polyethylene glycol) - Pick up over-the-counter to have on hand.  MiraLax is a solution that will increase the amount of water in your bowels to assist with bowel movements.  Take as directed and can mix with a glass of water, juice, soda, coffee, or tea.  Take if you go more than two days without a movement.Do not use MiraLax more than once per day. Call your doctor if you are still constipated or irregular after using this medication for 7 days in a row.  If you continue to have problems with postoperative constipation, please contact the office for further assistance and recommendations.  If you experience "the worst abdominal pain ever" or develop nausea or vomiting, please contact the office immediatly for further recommendations for treatment.  ITCHING  If you experience itching with your medications, try taking only a single pain pill, or even half a pain pill at a time.  You can also use Benadryl over the counter for itching or also to help with sleep.   MEDICATIONS See your medication summary on the "After Visit Summary" that the nursing staff will review with you prior to discharge.  You may have some home  medications which will be placed on hold until you complete the course of blood thinner medication.  It is important for you to complete the blood thinner medication as prescribed by your surgeon.  Continue your approved medications as instructed at time of discharge.  PRECAUTIONS If you experience chest pain or shortness of breath - call 911 immediately for transfer to the hospital emergency department.  If you develop a fever greater that 101 F, purulent drainage from wound, increased redness or drainage from wound, foul odor from the wound/dressing, or calf pain - CONTACT YOUR SURGEON.                                                   FOLLOW-UP APPOINTMENTS Make sure you keep all of your appointments after your operation with your surgeon and caregivers. You should call the office at the above phone number and make an appointment for approximately two  weeks after the date of your surgery or on the date instructed by your surgeon outlined in the "After Visit Summary".  RANGE OF MOTION AND STRENGTHENING EXERCISES  These exercises are designed to help you keep full movement of your hip joint. Follow your caregiver's or physical therapist's instructions. Perform all exercises about fifteen times, three times per day or as directed. Exercise both hips, even if you have had only one joint replacement. These exercises can be done on a training (exercise) mat, on the floor, on a table or on a bed. Use whatever works the best and is most comfortable for you. Use music or television while you are exercising so that the exercises are a pleasant break in your day. This will make your life better with the exercises acting as a break in routine you can look forward to.  . Lying on your back, slowly slide your foot toward your buttocks, raising your knee up off the floor. Then slowly slide your foot back down until your leg is straight again.  . Lying on your back spread your legs as far apart as you can without  causing discomfort.  . Lying on your side, raise your upper leg and foot straight up from the floor as far as is comfortable. Slowly lower the leg and repeat.  . Lying on your back, tighten up the muscle in the front of your thigh (quadriceps muscles). You can do this by keeping your leg straight and trying to raise your heel off the floor. This helps strengthen the largest muscle supporting your knee.  . Lying on your back, tighten up the muscles of your buttocks both with the legs straight and with the knee bent at a comfortable angle while keeping your heel on the floor.   POST-OPERATIVE OPIOID TAPER INSTRUCTIONS: . It is important to wean off of your opioid medication as soon as possible. If you do not need pain medication after your surgery it is ok to stop day one. Marland Kitchen Opioids include: o Codeine, Hydrocodone(Norco, Vicodin), Oxycodone(Percocet, oxycontin) and hydromorphone amongst others.  . Long term and even short term use of opiods can cause: o Increased pain response o Dependence o Constipation o Depression o Respiratory depression o And more.  . Withdrawal symptoms can include o Flu like symptoms o Nausea, vomiting o And more . Techniques to manage these symptoms o Hydrate well o Eat regular healthy meals o Stay active o Use relaxation techniques(deep breathing, meditating, yoga) . Do Not substitute Alcohol to help with tapering . If you have been on opioids for less than two weeks and do not have pain than it is ok to stop all together.  . Plan to wean off of opioids o This plan should start within one week post op of your joint replacement. o Maintain the same interval or time between taking each dose and first decrease the dose.  o Cut the total daily intake of opioids by one tablet each day o Next start to increase the time between doses. o The last dose that should be eliminated is the evening dose.     IF YOU ARE TRANSFERRED TO A SKILLED REHAB FACILITY If the  patient is transferred to a skilled rehab facility following release from the hospital, a list of the current medications will be sent to the facility for the patient to continue.  When discharged from the skilled rehab facility, please have the facility set up the patient's [place] prior to being  released. Also, the skilled facility will be responsible for providing the patient with their medications at time of release from the facility to include their pain medication, the muscle relaxants, and their blood thinner medication. If the patient is still at the rehab facility at time of the two week follow up appointment, the skilled rehab facility will also need to assist the patient in arranging follow up appointment in our office and any transportation needs.  MAKE SURE YOU:  . Understand these instructions.  . Get help right away if you are not doing well or get worse.    DENTAL ANTIBIOTICS:  In most cases prophylactic antibiotics for Dental procdeures after total joint surgery are not necessary.  Exceptions are as follows:  1. History of prior total joint infection  2. Severely immunocompromised (Organ Transplant, cancer chemotherapy, Rheumatoid biologic meds such as Day)  3. Poorly controlled diabetes (A1C &gt; 8.0, blood glucose over 200)  If you have one of these conditions, contact your surgeon for an antibiotic prescription, prior to your dental procedure.    Pick up stool softner and laxative for home use following surgery while on pain medications. Do not submerge incision under water. Please use good hand washing techniques while changing dressing each day. May shower starting three days after surgery. Please use a clean towel to pat the incision dry following showers. Continue to use ice for pain and swelling after surgery. Do not use any lotions or creams on the incision until instructed by your surgeon  Information on my medicine - XARELTO  (Rivaroxaban)  Why was Xarelto prescribed for you? Xarelto was prescribed for you to reduce the risk of blood clots forming after orthopedic surgery. The medical term for these abnormal blood clots is venous thromboembolism (VTE).  What do you need to know about xarelto ? Take your Xarelto ONCE DAILY at the same time every day. You may take it either with or without food.  Hold daily aspirin until after completing Xarelto prescription.   If you have difficulty swallowing the tablet whole, you may crush it and mix in applesauce just prior to taking your dose.  Take Xarelto exactly as prescribed by your doctor and DO NOT stop taking Xarelto without talking to the doctor who prescribed the medication.  Stopping without other VTE prevention medication to take the place of Xarelto may increase your risk of developing a clot.  After discharge, you should have regular check-up appointments with your healthcare provider that is prescribing your Xarelto.    What do you do if you miss a dose? If you miss a dose, take it as soon as you remember on the same day then continue your regularly scheduled once daily regimen the next day. Do not take two doses of Xarelto on the same day.   Important Safety Information A possible side effect of Xarelto is bleeding. You should call your healthcare provider right away if you experience any of the following: ? Bleeding from an injury or your nose that does not stop. ? Unusual colored urine (red or dark brown) or unusual colored stools (red or black). ? Unusual bruising for unknown reasons. ? A serious fall or if you hit your head (even if there is no bleeding).  Some medicines may interact with Xarelto and might increase your risk of bleeding while on Xarelto. To help avoid this, consult your healthcare provider or pharmacist prior to using any new prescription or non-prescription medications, including herbals, vitamins, non-steroidal  anti-inflammatory drugs (NSAIDs)  and supplements.  This website has more information on Xarelto: [email or web address]

## 2020-09-03 NOTE — Plan of Care (Signed)
Plan of care discussed with pt.

## 2020-09-03 NOTE — Interval H&P Note (Signed)
History and Physical Interval Note:  09/03/2020 8:10 AM  Veronica Logan  has presented today for surgery, with the diagnosis of Right hip osteoarthritis.  The various methods of treatment have been discussed with the patient and family. After consideration of risks, benefits and other options for treatment, the patient has consented to  Procedure(s) with comments: Ransom (Right) - 143min as a surgical intervention.  The patient's history has been reviewed, patient examined, no change in status, stable for surgery.  I have reviewed the patient's chart and labs.  Questions were answered to the patient's satisfaction.     Pilar Plate Jeanie Mccard

## 2020-09-03 NOTE — Anesthesia Postprocedure Evaluation (Signed)
Anesthesia Post Note  Patient: Veronica Logan  Procedure(s) Performed: TOTAL HIP ARTHROPLASTY ANTERIOR APPROACH (Right Hip)     Patient location during evaluation: PACU Anesthesia Type: Spinal Level of consciousness: oriented and awake and alert Pain management: pain level controlled Vital Signs Assessment: post-procedure vital signs reviewed and stable Respiratory status: spontaneous breathing and respiratory function stable Cardiovascular status: blood pressure returned to baseline and stable Postop Assessment: no headache, no backache, no apparent nausea or vomiting, spinal receding and patient able to bend at knees Anesthetic complications: no   No complications documented.  Last Vitals:  Vitals:   09/03/20 1239 09/03/20 1300  BP:  (!) 162/56  Pulse: 64 62  Resp: 15   Temp:    SpO2: 98% 100%    Last Pain:  Vitals:   09/03/20 1218  TempSrc:   PainSc: 0-No pain                 Kianna Billet,W. EDMOND

## 2020-09-03 NOTE — Evaluation (Signed)
Physical Therapy Evaluation Patient Details Name: Veronica Logan MRN: [id number] DOB: 07/16/1940 Today's Date: 09/03/2020   History of Present Illness  80 yo female, S/P right THA through Direct anterior.  Clinical Impression  Patient ambulated x 45'.  Patient should progress well to DC home with daughter. Patient reports  Pain is gone, just muscle/surgery soreness.  Pt admitted with above diagnosis.  Pt currently with functional limitations due to the deficits listed below (see PT Problem List). Pt will benefit from skilled PT to increase their independence and safety with mobility to allow discharge to the venue listed below.       Follow Up Recommendations Follow surgeon's recommendation for DC plan and follow-up therapies    Equipment Recommendations  None recommended by PT    Recommendations for Other Services       Precautions / Restrictions Precautions Precautions: Fall Restrictions Weight Bearing Restrictions: No      Mobility  Bed Mobility Overal bed mobility: Needs Assistance Bed Mobility: Supine to Sit     Supine to sit: Supervision;HOB elevated     General bed mobility comments: no extra assistance required    Transfers Overall transfer level: Needs assistance Equipment used: Rolling walker (2 wheeled) Transfers: Sit to/from Stand Sit to Stand: Min assist         General transfer comment: cues for ahnd placement  Ambulation/Gait Ambulation/Gait assistance: Min guard Gait Distance (Feet): 45 Feet Assistive device: Rolling walker (2 wheeled) Gait Pattern/deviations: Step-through pattern;Antalgic Gait velocity: decr   General Gait Details: mildly antalgic on RLE  Stairs            Wheelchair Mobility    Modified Rankin (Stroke Patients Only)       Balance Overall balance assessment: Needs assistance Sitting-balance support: Feet supported;No upper extremity supported Sitting balance-Leahy Scale: Good     Standing balance  support: During functional activity;Bilateral upper extremity supported Standing balance-Leahy Scale: Good Standing balance comment: with RW                             Pertinent Vitals/Pain Pain Assessment: 0-10 Pain Score: 2  Pain Location: right thigh Pain Descriptors / Indicators: Tightness;Sore Pain Intervention(s): Monitored during session;Premedicated before session;Ice applied    Home Living Family/patient expects to be discharged to:: Private residence Living Arrangements: Alone Available Help at Discharge: Family;Available 24 hours/day Type of Home: House Home Access: Stairs to enter   [employer] of Steps: 1 Home Layout: One level Home Equipment: Environmental consultant - 2 wheels Additional Comments: will stay with daughtr    Prior Function Level of Independence: Independent         Comments: 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     Hand Dominance   Dominant Hand: Right    Extremity/Trunk Assessment   Upper Extremity Assessment Upper Extremity Assessment: Overall WFL for tasks assessed    Lower Extremity Assessment Lower Extremity Assessment: RLE deficits/detail RLE Deficits / Details: advances right leg, active knee extension and hip flexion    Cervical / Trunk Assessment Cervical / Trunk Assessment: Normal  Communication   Communication: No difficulties  Cognition Arousal/Alertness: Awake/alert Behavior During Therapy: WFL for tasks assessed/performed Overall Cognitive Status: Within Functional Limits for tasks assessed  General Comments      Exercises Total Joint Exercises Ankle Circles/Pumps: AROM;Both;10 reps Long Arc Quad: AROM;Left;10 reps;Seated   Assessment/Plan    PT Assessment Patient needs continued PT services  PT Problem List Decreased strength;Decreased mobility;Decreased safety awareness;Decreased knowledge of precautions;Decreased activity  tolerance;Decreased knowledge of use of DME       PT Treatment Interventions DME instruction;Therapeutic activities;Gait training;Therapeutic exercise;Patient/family education;Stair training;Functional mobility training    PT Goals (Current goals can be found in the Care Plan section)  Acute Rehab PT Goals Patient Stated Goal: to go to work Monday PT Goal Formulation: With patient/family Time For Goal Achievement: 09/10/20 Potential to Achieve Goals: Good    Frequency 7X/week   Barriers to discharge        Co-evaluation               AM-PAC PT "6 Clicks" Mobility  Outcome Measure Help needed turning from your back to your side while in a flat bed without using bedrails?: A Little Help needed moving from lying on your back to sitting on the side of a flat bed without using bedrails?: A Little Help needed moving to and from a bed to a chair (including a wheelchair)?: A Little Help needed standing up from a chair using your arms (e.g., wheelchair or bedside chair)?: A Little Help needed to walk in hospital room?: A Little Help needed climbing 3-5 steps with a railing? : A Little 6 Click Score: 18    End of Session Equipment Utilized During Treatment: Gait belt Activity Tolerance: Patient tolerated treatment well Patient left: in chair;with call bell/phone within reach;with chair alarm set;with family/visitor present Nurse Communication: Mobility status PT Visit Diagnosis: Difficulty in walking, not elsewhere classified (R26.2)    Time: 9622-2979 PT Time Calculation (min) (ACUTE ONLY): 29 min   Charges:     PT Treatments $Gait Training: 8-22 mins        [place] Pager [phone] Office [phone]   Claretha Cooper 09/03/2020, 4:54 PM

## 2020-09-03 NOTE — Op Note (Signed)
OPERATIVE REPORT- TOTAL HIP ARTHROPLASTY   PREOPERATIVE DIAGNOSIS: Osteoarthritis of the Right hip.   POSTOPERATIVE DIAGNOSIS: Osteoarthritis of the Right  hip.   PROCEDURE: Right total hip arthroplasty, anterior approach.   SURGEON: Gaynelle Arabian, MD   ASSISTANT: Fenton Foy, PA-C  ANESTHESIA:  Spinal  ESTIMATED BLOOD LOSS:-100 mL    DRAINS: Hemovac x1.   COMPLICATIONS: None   CONDITION: PACU - hemodynamically stable.   BRIEF CLINICAL NOTE: Veronica Logan is a 80 y.o. female who has advanced end-  stage arthritis of their Right  hip with progressively worsening pain and  dysfunction.The patient has failed nonoperative management and presents for  total hip arthroplasty.   PROCEDURE IN DETAIL: After successful administration of spinal  anesthetic, the traction boots for the [facility] bed were placed on both  feet and the patient was placed onto the [facility] bed, boots placed into the leg  holders. The Right hip was then isolated from the perineum with plastic  drapes and prepped and draped in the usual sterile fashion. ASIS and  greater trochanter were marked and a oblique incision was made, starting  at about 1 cm lateral and 2 cm distal to the ASIS and coursing towards  the anterior cortex of the femur. The skin was cut with a 10 blade  through subcutaneous tissue to the level of the fascia overlying the  tensor fascia lata muscle. The fascia was then incised in line with the  incision at the junction of the anterior third and posterior 2/3rd. The  muscle was teased off the fascia and then the interval between the TFL  and the rectus was developed. The Hohmann retractor was then placed at  the top of the femoral neck over the capsule. The vessels overlying the  capsule were cauterized and the fat on top of the capsule was removed.  A Hohmann retractor was then placed anterior underneath the rectus  femoris to give exposure to the entire anterior capsule. A  T-shaped  capsulotomy was performed. The edges were tagged and the femoral head  was identified.       Osteophytes are removed off the superior acetabulum.  The femoral neck was then cut in situ with an oscillating saw. Traction  was then applied to the left lower extremity utilizing the [facility]  traction. The femoral head was then removed. Retractors were placed  around the acetabulum and then circumferential removal of the labrum was  performed. Osteophytes were also removed. Reaming starts at 45 mm to  medialize and  Increased in 2 mm increments to 47 mm. We reamed in  approximately 40 degrees of abduction, 20 degrees anteversion. A 48 mm  pinnacle acetabular shell was then impacted in anatomic position under  fluoroscopic guidance with excellent purchase. We did not need to place  any additional dome screws. A 28 mm neutral + 4 marathon liner was then  placed into the acetabular shell.       The femoral lift was then placed along the lateral aspect of the femur  just distal to the vastus ridge. The leg was  externally rotated and capsule  was stripped off the inferior aspect of the femoral neck down to the  level of the lesser trochanter, this was done with electrocautery. The femur was lifted after this was performed. The  leg was then placed in an extended and adducted position essentially delivering the femur. We also removed the capsule superiorly and the piriformis from the piriformis  fossa to gain excellent exposure of the  proximal femur. Rongeur was used to remove some cancellous bone to get  into the lateral portion of the proximal femur for placement of the  initial starter reamer. The starter broaches was placed  the starter broach  and was shown to go down the center of the canal. Broaching  with the Actis system was then performed starting at size 0  coursing  Up to size 7. A size 7 had excellent torsional and rotational  and axial stability. The trial high offset neck was then  placed  with a 28 + 5 trial head. The hip was then reduced. We confirmed that  the stem was in the canal both on AP and lateral x-rays. It also has excellent sizing. The hip was reduced with outstanding stability through full extension and full external rotation.. AP pelvis was taken and the leg lengths were measured and found to be equal. Hip was then dislocated again and the femoral head and neck removed. The  femoral broach was removed. Size 7 Actis stem with a high offset  neck was then impacted into the femur following native anteversion. Has  excellent purchase in the canal. Excellent torsional and rotational and  axial stability. It is confirmed to be in the canal on AP and lateral  fluoroscopic views. The 28 + 5 metal head was placed and the hip  reduced with outstanding stability. Again AP pelvis was taken and it  confirmed that the leg lengths were equal. The wound was then copiously  irrigated with saline solution and the capsule reattached and repaired  with Ethibond suture. 30 ml of .25% Bupivicaine was  injected into the capsule and into the edge of the tensor fascia lata as well as subcutaneous tissue. The fascia overlying the tensor fascia lata was then closed with a running #1 V-Loc. Subcu was closed with interrupted 2-0 Vicryl and subcuticular running 4-0 Monocryl. Incision was cleaned  and dried. Steri-Strips and a bulky sterile dressing applied. Hemovac  drain was hooked to suction and then the patient was awakened and transported to  recovery in stable condition.        Please note that a surgical assistant was a medical necessity for this procedure to perform it in a safe and expeditious manner. Assistant was necessary to provide appropriate retraction of vital neurovascular structures and to prevent femoral fracture and allow for anatomic placement of the prosthesis.  Gaynelle Arabian, M.D.

## 2020-09-03 NOTE — Transfer of Care (Signed)
Immediate Anesthesia Transfer of Care Note  Patient: Veronica Logan  Procedure(s) Performed: Procedure(s) with comments: TOTAL HIP ARTHROPLASTY ANTERIOR APPROACH (Right) - 173min  Patient Location: PACU  Anesthesia Type:Spinal  Level of Consciousness: awake, alert  and oriented  Airway & Oxygen Therapy: Patient Spontanous Breathing  Post-op Assessment: Report given to RN and Post -op Vital signs reviewed and stable  Post vital signs: Reviewed and stable  Last Vitals:  Vitals:   09/03/20 0803  BP: (!) 154/46  Pulse: (!) 52  Resp: 16  Temp: 36.7 C  SpO2: 528%    Complications: No apparent anesthesia complications

## 2020-09-04 ENCOUNTER — Encounter (HOSPITAL_COMMUNITY): Payer: Self-pay | Admitting: Orthopedic Surgery

## 2020-09-04 DIAGNOSIS — M1611 Unilateral primary osteoarthritis, right hip: Secondary | ICD-10-CM | POA: Diagnosis not present

## 2020-09-04 LAB — CBC
HCT: 35.8 % — ABNORMAL LOW (ref 36.0–46.0)
Hemoglobin: 11.9 g/dL — ABNORMAL LOW (ref 12.0–15.0)
MCH: 29.8 pg (ref 26.0–34.0)
MCHC: 33.2 g/dL (ref 30.0–36.0)
MCV: 89.5 fL (ref 80.0–100.0)
Platelets: 226 10*3/uL (ref 150–400)
RBC: 4 MIL/uL (ref 3.87–5.11)
RDW: 12.7 % (ref 11.5–15.5)
WBC: 9.7 10*3/uL (ref 4.0–10.5)
nRBC: 0 % (ref 0.0–0.2)

## 2020-09-04 LAB — BASIC METABOLIC PANEL
Anion gap: 10 (ref 5–15)
BUN: 29 mg/dL — ABNORMAL HIGH (ref 8–23)
CO2: 25 mmol/L (ref 22–32)
Calcium: 9.3 mg/dL (ref 8.9–10.3)
Chloride: 105 mmol/L (ref 98–111)
Creatinine, Ser: 1.32 mg/dL — ABNORMAL HIGH (ref 0.44–1.00)
GFR, Estimated: 41 mL/min — ABNORMAL LOW (ref >60)
Glucose, Bld: 151 mg/dL — ABNORMAL HIGH (ref 70–99)
Potassium: 4.1 mmol/L (ref 3.5–5.1)
Sodium: 140 mmol/L (ref 135–145)

## 2020-09-04 MED ORDER — TRAMADOL HCL 50 MG PO TABS: 50.0000 mg | ORAL_TABLET | Freq: Four times a day (QID) | ORAL | 0 refills | Status: AC | PRN

## 2020-09-04 MED ORDER — METHOCARBAMOL 500 MG PO TABS
500.0000 mg | ORAL_TABLET | Freq: Four times a day (QID) | ORAL | 0 refills | Status: AC | PRN
Start: 2020-09-04 — End: ?

## 2020-09-04 MED ORDER — HYDROCODONE-ACETAMINOPHEN 5-325 MG PO TABS: 1.0000 | ORAL_TABLET | Freq: Four times a day (QID) | ORAL | 0 refills | Status: AC | PRN

## 2020-09-04 MED ORDER — RIVAROXABAN 10 MG PO TABS
10.0000 mg | ORAL_TABLET | Freq: Every day | ORAL | 0 refills | Status: AC
Start: 2020-09-04 — End: ?

## 2020-09-04 NOTE — Plan of Care (Signed)
Plan of care discussed with pt.

## 2020-09-04 NOTE — Progress Notes (Signed)
Physical Therapy Treatment Patient Details Name: Veronica Logan MRN: [id number] DOB: Dec 19, 1940 Today's Date: 09/04/2020    History of Present Illness 80 yo female, S/P right THA through Direct anterior.    PT Comments    POD # 1 am session Assisted OOB to amb in hallway with daughter "hands on" instructed on safe handling activity level.  Then returned to room to perform some TE's following HEP handout.  Instructed on proper tech, freq as well as use of ICE.   Pt will need another PT session to complete HEP and practice one step she has to enter her home.   Follow Up Recommendations  Follow surgeon's recommendation for DC plan and follow-up therapies     Equipment Recommendations  None recommended by PT    Recommendations for Other Services       Precautions / Restrictions Precautions Precautions: Fall Restrictions Weight Bearing Restrictions: No    Mobility  Bed Mobility Overal bed mobility: Needs Assistance Bed Mobility: Supine to Sit     Supine to sit: Supervision;Min guard     General bed mobility comments: demonstarted and instructed how to uase belt to self assist LE off bed    Transfers Overall transfer level: Needs assistance   Transfers: Sit to/from Stand Sit to Stand: Supervision         General transfer comment: self asble with increased time  Ambulation/Gait Ambulation/Gait assistance: Supervision Gait Distance (Feet): 55 Feet Assistive device: Rolling walker (2 wheeled) Gait Pattern/deviations: Step-through pattern;Antalgic     General Gait Details: 25% VC's on safety with turns   Stairs             Wheelchair Mobility    Modified Rankin (Stroke Patients Only)       Balance                                            Cognition Arousal/Alertness: Awake/alert Behavior During Therapy: WFL for tasks assessed/performed Overall Cognitive Status: Within Functional Limits for tasks assessed                                  General Comments: AxO x 3 very sweet      Exercises   Total Hip Replacement TE's following HEP Handout 10 reps ankle pumps 05 reps knee presses 05 reps heel slides 05 reps SAQ's 05 reps ABD Instructed how to use a belt loop to assist  Followed by ICE    General Comments        Pertinent Vitals/Pain Pain Assessment: Faces Faces Pain Scale: Hurts a little bit Pain Location: right thigh Pain Descriptors / Indicators: Tightness;Sore;Tender;Operative site guarding Pain Intervention(s): Monitored during session;Premedicated before session;Repositioned;Ice applied    Home Living                      Prior Function            PT Goals (current goals can now be found in the care plan section) Progress towards PT goals: Progressing toward goals    Frequency    7X/week      PT Plan Current plan remains appropriate    Co-evaluation              AM-PAC PT "6 Clicks" Mobility   Outcome Measure  Help  needed turning from your back to your side while in a flat bed without using bedrails?: A Little Help needed moving from lying on your back to sitting on the side of a flat bed without using bedrails?: A Little Help needed moving to and from a bed to a chair (including a wheelchair)?: A Little Help needed standing up from a chair using your arms (e.g., wheelchair or bedside chair)?: A Little Help needed to walk in hospital room?: A Little Help needed climbing 3-5 steps with a railing? : A Little 6 Click Score: 18    End of Session Equipment Utilized During Treatment: Gait belt Activity Tolerance: Patient tolerated treatment well Patient left: in chair;with call bell/phone within reach;with chair alarm set;with family/visitor present Nurse Communication: Mobility status PT Visit Diagnosis: Difficulty in walking, not elsewhere classified (R26.2)     Time: 6283-1517 PT Time Calculation (min) (ACUTE ONLY): 25  min  Charges:  $Gait Training: 8-22 mins $Therapeutic Activity: 8-22 mins                     Rica Koyanagi  PTA Acute  Rehabilitation Services Pager      [phone] Office      [phone]

## 2020-09-04 NOTE — Progress Notes (Signed)
Physical Therapy Treatment Patient Details Name: Veronica Logan MRN: [id number] DOB: 26-Nov-1940 Today's Date: 09/04/2020    History of Present Illness 80 yo female, S/P right THA through Direct anterior.    PT Comments    POD # 2nd session Assisted with practicing one step pt has to enter her home.  General stair comments: with daughter "hands on" instructed on proper walker placement, proper sequencing and proper assist level. Performed all standing TE's following HEP handout. Addressed all mobility questions, discussed appropriate activity, educated on use of ICE.  Pt ready for D/C to home.   Follow Up Recommendations  Follow surgeon's recommendation for DC plan and follow-up therapies     Equipment Recommendations  None recommended by PT    Recommendations for Other Services       Precautions / Restrictions Precautions Precautions: Fall Restrictions Weight Bearing Restrictions: No    Mobility  Bed Mobility Overal bed mobility: Needs Assistance Bed Mobility: Supine to Sit     Supine to sit: Supervision;Min guard     General bed mobility comments: OOB in recliner    Transfers Overall transfer level: Needs assistance   Transfers: Sit to/from Stand Sit to Stand: Supervision         General transfer comment: self asble with increased time  Ambulation/Gait Ambulation/Gait assistance: Supervision Gait Distance (Feet): 15 Feet Assistive device: Rolling walker (2 wheeled) Gait Pattern/deviations: Step-through pattern;Antalgic     General Gait Details: 25% VC's on safety with turns   Stairs Stairs: Yes Stairs assistance: Min guard;Supervision Stair Management: No rails;Step to pattern;Forwards;With walker Number of Stairs: 1 General stair comments: with daughter "hands on" instructed on proper walker placement, proper sequencing and proper assist level.   Wheelchair Mobility    Modified Rankin (Stroke Patients Only)       Balance                                             Cognition Arousal/Alertness: Awake/alert Behavior During Therapy: WFL for tasks assessed/performed Overall Cognitive Status: Within Functional Limits for tasks assessed                                 General Comments: AxO x 3 very sweet      Exercises  05 reps all standing TE's     General Comments        Pertinent Vitals/Pain Pain Assessment: Faces Faces Pain Scale: Hurts a little bit Pain Location: right thigh Pain Descriptors / Indicators: Tightness;Sore;Tender;Operative site guarding Pain Intervention(s): Monitored during session;Premedicated before session;Repositioned;Ice applied    Home Living                      Prior Function            PT Goals (current goals can now be found in the care plan section) Progress towards PT goals: Progressing toward goals    Frequency    7X/week      PT Plan Current plan remains appropriate    Co-evaluation              AM-PAC PT "6 Clicks" Mobility   Outcome Measure  Help needed turning from your back to your side while in a flat bed without using bedrails?: A Little Help needed moving from lying on  your back to sitting on the side of a flat bed without using bedrails?: A Little Help needed moving to and from a bed to a chair (including a wheelchair)?: A Little Help needed standing up from a chair using your arms (e.g., wheelchair or bedside chair)?: A Little Help needed to walk in hospital room?: A Little Help needed climbing 3-5 steps with a railing? : A Little 6 Click Score: 18    End of Session Equipment Utilized During Treatment: Gait belt Activity Tolerance: Patient tolerated treatment well Patient left: in chair;with call bell/phone within reach;with chair alarm set;with family/visitor present Nurse Communication: Mobility status PT Visit Diagnosis: Difficulty in walking, not elsewhere classified (R26.2)     Time:  8676-7209 PT Time Calculation (min) (ACUTE ONLY): 25 min  Charges:  $Gait Training: 8-22 mins $Therapeutic Exercise: 8-22 mins                      Rica Koyanagi  PTA Acute  Rehabilitation Services Pager      [phone] Office      [phone]

## 2020-09-04 NOTE — TOC Transition Note (Signed)
Transition of Care [facility]) - CM/SW Discharge Note   Patient Details  Name: Veronica Logan MRN: [id number] Date of Birth: 1941/01/26  [facility] of [facility]) CM/SW Contact:  Lennart Pall, LCSW Phone Number: 09/04/2020, 9:54 AM   Clinical Narrative:    Met with pt this morning and confirming she has all needed DME.  Plan for HEP at home.  No TOC needs.   Final next level of care: Home/Self Care Barriers to Discharge: No Barriers Identified   Patient Goals and CMS Choice Patient states their goals for this hospitalization and ongoing recovery are:: return home      Discharge Placement                       Discharge Plan and Services                DME Arranged: N/A DME Agency: NA                  Social Determinants of Health (SDOH) Interventions     Readmission Risk Interventions No flowsheet data found.

## 2020-09-04 NOTE — Progress Notes (Signed)
   Subjective: 1 Day Post-Op Procedure(s) (LRB): TOTAL HIP ARTHROPLASTY ANTERIOR APPROACH (Right) Patient reports pain as mild.   Patient seen in rounds by Dr. Wynelle Link. Patient is well, and has had no acute complaints or problems. Denies SOB, chest pain, or calf pain. No acute overnight events. Patient ambulated 45 feet with PT yesterday.  We will continue therapy today.   Objective: Vital signs in last 24 hours: Temp:  [97 F (36.1 C)-98.1 F (36.7 C)] 97.5 F (36.4 C) (05/05 0648) Pulse Rate:  [52-68] 66 (05/05 0648) Resp:  [13-18] 16 (05/05 0648) BP: (112-162)/(35-73) 126/47 (05/05 0648) SpO2:  [98 %-100 %] 100 % (05/05 0648) Weight:  [68.9 kg] 68.9 kg (05/04 0756)  Intake/Output from previous day:  Intake/Output Summary (Last 24 hours) at 09/04/2020 0755 Last data filed at 09/04/2020 0648 Gross per 24 hour  Intake 4143.68 ml  Output 1650 ml  Net 2493.68 ml     Intake/Output this shift: No intake/output data recorded.  Labs: Recent Labs    09/01/20 1055 09/04/20 0313  HGB 12.4 11.9*   Recent Labs    09/01/20 1055 09/04/20 0313  WBC 6.1 9.7  RBC 4.18 4.00  HCT 38.5 35.8*  PLT 228 226   Recent Labs    09/01/20 1055 09/04/20 0313  NA 139 140  K 4.7 4.1  CL 108 105  CO2 25 25  BUN 33* 29*  CREATININE 1.25* 1.32*  GLUCOSE 97 151*  CALCIUM 9.5 9.3   Recent Labs    09/01/20 1055  INR 0.9    Exam: General - Patient is Alert and Oriented Extremity - Neurologically intact Neurovascular intact Intact pulses distally Dorsiflexion/Plantar flexion intact Dressing - dressing C/D/I Motor Function - intact, moving foot and toes well on exam.   Past Medical History:  Diagnosis Date  . Arthritis   . Cancer ([place])    colon  . Carotid stenosis   . Chronic kidney disease    stage 3  . Hyperlipidemia   . Hypertension   . Hypothyroidism   . Pneumonia     Assessment/Plan: 1 Day Post-Op Procedure(s) (LRB): TOTAL HIP ARTHROPLASTY ANTERIOR APPROACH  (Right) Principal Problem:   OA (osteoarthritis) of hip Active Problems:   Osteoarthritis of right hip  Estimated body mass index is 25.28 kg/m as calculated from the following:   Height as of this encounter: 5\' 5"  (1.651 m).   Weight as of this encounter: 68.9 kg. Up with therapy  DVT Prophylaxis - Xarelto Weight bearing as tolerated. Continue therapy.  Plan is to go Home after hospital stay.   Plan for PT this morning, and if meeting goals, will plan for discharge this afternoon.   Patient to follow up with Dr. Wynelle Link in two weeks in clinic.   The PDMP database was reviewed today prior to any opioid medications being prescribed to this patient.  Fenton Foy, [place], PA-C Orthopedic Surgery [phone] 09/04/2020, 7:55 AM

## 2020-09-05 ENCOUNTER — Encounter (HOSPITAL_COMMUNITY): Payer: Self-pay | Admitting: Orthopedic Surgery

## 2020-09-05 NOTE — Discharge Summary (Signed)
Physician Discharge Summary   Patient ID: Veronica Logan MRN: [id number] DOB/AGE: [birth year] 80 y.o.  Admit date: 09/03/2020 Discharge date: 09/04/2020  Primary Diagnosis:  S/p R THA  Admission Diagnoses:  Past Medical History:  Diagnosis Date  . Arthritis   . Cancer ([place])    colon  . Carotid stenosis   . Chronic kidney disease    stage 3  . Hyperlipidemia   . Hypertension   . Hypothyroidism   . Pneumonia    Discharge Diagnoses:   Principal Problem:   OA (osteoarthritis) of hip Active Problems:   Osteoarthritis of right hip  Estimated body mass index is 25.28 kg/m as calculated from the following:   Height as of this encounter: 5\' 5"  (1.651 m).   Weight as of this encounter: 68.9 kg.  Procedure:  Procedure(s) (LRB): TOTAL HIP ARTHROPLASTY ANTERIOR APPROACH (Right)   Consults: None  HPI: Veronica Logan is a 80 y.o. female who has advanced end-  stage arthritis of their Right  hip with progressively worsening pain and  dysfunction.The patient has failed nonoperative management and presents for  total hip arthroplasty.   Laboratory Data: Admission on 09/03/2020, Discharged on 09/04/2020  Component Date Value Ref Range Status  . ABO/RH(D) 09/03/2020    Final                   Value:O POS Performed at [facility], [place] [address] [place], [place] [address]   . WBC 09/04/2020 9.7  4.0 - 10.5 K/uL Final  . RBC 09/04/2020 4.00  3.87 - 5.11 MIL/uL Final  . Hemoglobin 09/04/2020 11.9* 12.0 - 15.0 g/dL Final  . HCT 09/04/2020 35.8* 36.0 - 46.0 % Final  . MCV 09/04/2020 89.5  80.0 - 100.0 fL Final  . MCH 09/04/2020 29.8  26.0 - 34.0 pg Final  . MCHC 09/04/2020 33.2  30.0 - 36.0 g/dL Final  . RDW 09/04/2020 12.7  11.5 - 15.5 % Final  . Platelets 09/04/2020 226  150 - 400 K/uL Final  . nRBC 09/04/2020 0.0  0.0 - 0.2 % Final   Performed at [facility], [place] [address] [place], [place] [address]  . Sodium 09/04/2020 140   135 - 145 mmol/L Final  . Potassium 09/04/2020 4.1  3.5 - 5.1 mmol/L Final  . Chloride 09/04/2020 105  98 - 111 mmol/L Final  . CO2 09/04/2020 25  22 - 32 mmol/L Final  . Glucose, Bld 09/04/2020 151* 70 - 99 mg/dL Final   Glucose reference range applies only to samples taken after fasting for at least 8 hours.  . BUN 09/04/2020 29* 8 - 23 mg/dL Final  . Creatinine, Ser 09/04/2020 1.32* 0.44 - 1.00 mg/dL Final  . Calcium 09/04/2020 9.3  8.9 - 10.3 mg/dL Final  . GFR, Estimated 09/04/2020 41* >60 mL/min Final   Comment: (NOTE) Calculated using the CKD-EPI Creatinine Equation (2021)   . Anion gap 09/04/2020 10  5 - 15 Final   Performed at [facility], [place] [address] [place], [place] [address]  Hospital Outpatient Visit on 09/01/2020  Component Date Value Ref Range Status  . SARS Coronavirus 2 09/01/2020 NEGATIVE  NEGATIVE Final   Comment: (NOTE) SARS-CoV-2 target nucleic acids are NOT DETECTED.  The SARS-CoV-2 RNA is generally detectable in upper and lower respiratory specimens during the acute phase of infection. Negative results do not preclude SARS-CoV-2 infection, do not rule out co-infections with other pathogens, and should not be  used as the sole basis for treatment or other patient management decisions. Negative results must be combined with clinical observations, patient history, and epidemiological information. The expected result is Negative.  Fact Sheet for Patients: SugarRoll.be  Fact Sheet for Healthcare Providers: https://www.woods-mathews.com/  This test is not yet approved or cleared by the [place] FDA and  has been authorized for detection and/or diagnosis of SARS-CoV-2 by FDA under an Emergency Use Authorization (EUA). This EUA will remain  in effect (meaning this test can be used) for the duration of the COVID-19 declaration under Se                          ction 564(b)(1) of the Act, 21  U.S.C. section 360bbb-3(b)(1), unless the authorization is terminated or revoked sooner.  Performed at [facility], [place] [address], [place], [place] [address]   Hospital Outpatient Visit on 09/01/2020  Component Date Value Ref Range Status  . WBC 09/01/2020 6.1  4.0 - 10.5 K/uL Final  . RBC 09/01/2020 4.18  3.87 - 5.11 MIL/uL Final  . Hemoglobin 09/01/2020 12.4  12.0 - 15.0 g/dL Final  . HCT 09/01/2020 38.5  36.0 - 46.0 % Final  . MCV 09/01/2020 92.1  80.0 - 100.0 fL Final  . MCH 09/01/2020 29.7  26.0 - 34.0 pg Final  . MCHC 09/01/2020 32.2  30.0 - 36.0 g/dL Final  . RDW 09/01/2020 12.9  11.5 - 15.5 % Final  . Platelets 09/01/2020 228  150 - 400 K/uL Final  . nRBC 09/01/2020 0.0  0.0 - 0.2 % Final   Performed at [facility], [place] [address], [place], [place] [address]  . Sodium 09/01/2020 139  135 - 145 mmol/L Final  . Potassium 09/01/2020 4.7  3.5 - 5.1 mmol/L Final  . Chloride 09/01/2020 108  98 - 111 mmol/L Final  . CO2 09/01/2020 25  22 - 32 mmol/L Final  . Glucose, Bld 09/01/2020 97  70 - 99 mg/dL Final   Glucose reference range applies only to samples taken after fasting for at least 8 hours.  . BUN 09/01/2020 33* 8 - 23 mg/dL Final  . Creatinine, Ser 09/01/2020 1.25* 0.44 - 1.00 mg/dL Final  . Calcium 09/01/2020 9.5  8.9 - 10.3 mg/dL Final  . Total Protein 09/01/2020 6.6  6.5 - 8.1 g/dL Final  . Albumin 09/01/2020 3.8  3.5 - 5.0 g/dL Final  . AST 09/01/2020 21  15 - 41 U/L Final  . ALT 09/01/2020 13  0 - 44 U/L Final  . Alkaline Phosphatase 09/01/2020 77  38 - 126 U/L Final  . Total Bilirubin 09/01/2020 0.7  0.3 - 1.2 mg/dL Final  . GFR, Estimated 09/01/2020 43* >60 mL/min Final   Comment: (NOTE) Calculated using the CKD-EPI Creatinine Equation (2021)   . Anion gap 09/01/2020 6  5 - 15 Final   Performed at [facility], [place] [address] [place], [place] [address]  . Prothrombin Time 09/01/2020 12.5  11.4 - 15.2 seconds Final   . INR 09/01/2020 0.9  0.8 - 1.2 Final   Comment: (NOTE) INR goal varies based on device and disease states. Performed at [facility], [place] [address], [place], [place] [address]   . ABO/RH(D) 09/01/2020 O POS   Final  . Antibody Screen 09/01/2020 NEG   Final  . Sample Expiration 09/01/2020 09/06/2020,2359   Final  . Extend sample reason 09/01/2020  Final                   Value:NO TRANSFUSIONS OR PREGNANCY IN THE PAST 3 MONTHS Performed at [place] [address] [place], [place] [address]   . MRSA, PCR 09/01/2020 NEGATIVE  NEGATIVE Final  . Staphylococcus aureus 09/01/2020 NEGATIVE  NEGATIVE Final   Comment: (NOTE) The Xpert SA Assay (FDA approved for NASAL specimens in patients 61 years of age and older), is one component of a comprehensive surveillance program. It is not intended to diagnose infection nor to guide or monitor treatment. Performed at [facility], [place] [address], [place], [place] [address]      X-Rays:DG Pelvis Portable  Result Date: 09/03/2020 CLINICAL DATA:  Status post total hip replacement EXAM: PORTABLE PELVIS 1-2 VIEWS COMPARISON:  Intraoperative images right hip Sep 03, 2020 FINDINGS: There is a total hip replacement on the right with prosthetic components well-seated on frontal view. No fracture or dislocation. Left hip joint appears normal. IMPRESSION: Status post total hip replacement right with prosthetic components well-seated on frontal view. No fracture or dislocation. Left hip joint unremarkable in appearance. Electronically Signed   By: Lowella Grip III M.D.   On: 09/03/2020 12:30   DG C-Arm 1-60 Min-No Report  Result Date: 09/03/2020 CLINICAL DATA:  Right hip replacement. EXAM: OPERATIVE RIGHT HIP (WITH PELVIS IF PERFORMED) 4 VIEWS TECHNIQUE: Fluoroscopic spot image(s) were submitted for interpretation post-operatively. COMPARISON:  No prior. FINDINGS: Total right hip replacement.   Hardware intact.  Anatomic alignment. IMPRESSION: Total right hip replacement with anatomic alignment. Electronically Signed   By: Marcello Moores  Register   On: 09/03/2020 13:48   DG HIP OPERATIVE UNILAT W OR W/O PELVIS RIGHT  Result Date: 09/03/2020 CLINICAL DATA:  Right hip replacement. EXAM: OPERATIVE RIGHT HIP (WITH PELVIS IF PERFORMED) 4 VIEWS TECHNIQUE: Fluoroscopic spot image(s) were submitted for interpretation post-operatively. COMPARISON:  No prior. FINDINGS: Total right hip replacement.  Hardware intact.  Anatomic alignment. IMPRESSION: Total right hip replacement with anatomic alignment. Electronically Signed   By: Marcello Moores  Register   On: 09/03/2020 13:48    EKG:No orders found for this or any previous visit.   Hospital Course: Veronica Logan is a 80 y.o. who was admitted to [facility]. They were brought to the operating room on 09/03/2020 and underwent Procedure(s): [place].  Patient tolerated the procedure well and was later transferred to the recovery room and then to the orthopaedic floor for postoperative care. They were given PO and IV analgesics for pain control following their surgery. They were given 24 hours of postoperative antibiotics of  Anti-infectives (From admission, onward)   Start     Dose/Rate Route Frequency Ordered Stop   09/03/20 1600  ceFAZolin (ANCEF) IVPB 2g/100 mL premix        2 g 200 mL/hr over 30 Minutes Intravenous Every 6 hours 09/03/20 1304 09/03/20 2310   09/03/20 0800  ceFAZolin (ANCEF) IVPB 2g/100 mL premix        2 g 200 mL/hr over 30 Minutes Intravenous On call to O.R. 09/03/20 0981 09/03/20 1023     and started on DVT prophylaxis in the form of Xarelto.   PT and OT were ordered for total joint protocol. Discharge planning consulted to help with postop disposition and equipment needs.  Patient had an uneventful night on the evening of surgery. They started to get up OOB with therapy on 09/03/20. Pt was seen  during rounds and was ready to go home pending progress with therapy. She worked with therapy on POD #1 and was meeting goals. Pt was discharged to home later that day in stable condition.  Diet: Regular diet Activity: WBAT Follow-up: in two weeks Disposition: Home Discharged Condition: good   Discharge Instructions    Call MD / Call 911   Complete by: As directed    If you experience chest pain or shortness of breath, CALL 911 and be transported to the hospital emergency room.  If you develope a fever above 101 F, pus (white drainage) or increased drainage or redness at the wound, or calf pain, call your surgeon's office.   Change dressing   Complete by: As directed    You have an adhesive waterproof bandage over the incision. Leave this in place until your first follow-up appointment. Once you remove this you will not need to place another bandage.   Constipation Prevention   Complete by: As directed    Drink plenty of fluids.  Prune juice may be helpful.  You may use a stool softener, such as Colace (over the counter) 100 mg twice a day.  Use MiraLax (over the counter) for constipation as needed.   Diet - low sodium heart healthy   Complete by: As directed    Do not sit on low chairs, stoools or toilet seats, as it may be difficult to get up from low surfaces   Complete by: As directed    Driving restrictions   Complete by: As directed    No driving for two weeks   Post-operative opioid taper instructions:   Complete by: As directed    POST-OPERATIVE OPIOID TAPER INSTRUCTIONS: It is important to wean off of your opioid medication as soon as possible. If you do not need pain medication after your surgery it is ok to stop day one. Opioids include: Codeine, Hydrocodone(Norco, Vicodin), Oxycodone(Percocet, oxycontin) and hydromorphone amongst others.  Long term and even short term use of opiods can cause: Increased pain response Dependence Constipation Depression Respiratory  depression And more.  Withdrawal symptoms can include Flu like symptoms Nausea, vomiting And more Techniques to manage these symptoms Hydrate well Eat regular healthy meals Stay active Use relaxation techniques(deep breathing, meditating, yoga) Do Not substitute Alcohol to help with tapering If you have been on opioids for less than two weeks and do not have pain than it is ok to stop all together.  Plan to wean off of opioids This plan should start within one week post op of your joint replacement. Maintain the same interval or time between taking each dose and first decrease the dose.  Cut the total daily intake of opioids by one tablet each day Next start to increase the time between doses. The last dose that should be eliminated is the evening dose.      TED hose   Complete by: As directed    Use stockings (TED hose) for three weeks on both leg(s).  You may remove them at night for sleeping.   Weight bearing as tolerated   Complete by: As directed      Allergies as of 09/04/2020      Reactions   Aleve [naproxen]    Breaks mouth out       Medication List    STOP taking these medications   aspirin EC 81 MG tablet   OCUVITE ADULT 50+ PO     TAKE these medications   ezetimibe 10 MG tablet  Commonly known as: ZETIA Take 10 mg by mouth daily.   HYDROcodone-acetaminophen 5-325 MG tablet Commonly known as: NORCO/VICODIN Take 1-2 tablets by mouth every 6 (six) hours as needed for severe pain.   levothyroxine 88 MCG tablet Commonly known as: SYNTHROID Take 88 mcg by mouth daily.   lisinopril-hydrochlorothiazide 20-12.5 MG tablet Commonly known as: ZESTORETIC Take 1 tablet by mouth daily.   methocarbamol 500 MG tablet Commonly known as: ROBAXIN Take 1 tablet (500 mg total) by mouth every 6 (six) hours as needed for muscle spasms.   metoprolol tartrate 25 MG tablet Commonly known as: LOPRESSOR Take 25 mg by mouth 2 (two) times daily.   rivaroxaban 10 MG Tabs  tablet Commonly known as: XARELTO Take 1 tablet (10 mg total) by mouth daily with breakfast.   simvastatin 40 MG tablet Commonly known as: ZOCOR Take 40 mg by mouth daily.   traMADol 50 MG tablet Commonly known as: ULTRAM Take 1-2 tablets (50-100 mg total) by mouth every 6 (six) hours as needed for moderate pain.            Discharge Care Instructions  (From admission, onward)         Start     Ordered   09/04/20 0000  Weight bearing as tolerated        09/04/20 0804   09/04/20 0000  Change dressing       Comments: You have an adhesive waterproof bandage over the incision. Leave this in place until your first follow-up appointment. Once you remove this you will not need to place another bandage.   09/04/20 0804          Follow-up Information    Gaynelle Arabian, MD. Schedule an appointment as soon as possible for a visit on 09/16/2020.   Specialty: Orthopedic Surgery Contact information: [address] [place] [address] [phone]               Signed: Fenton Foy, MBA, PA-C Orthopedic Surgery 09/05/2020, 8:29 AM

## 2021-11-14 IMAGING — DX DG PORTABLE PELVIS
1 series · 1 of 1 positions shown · non-contrast
Comparison: Intraoperative images right hip September 03, 2020

CLINICAL DATA: Status post total hip replacement

EXAM:
PORTABLE PELVIS 1-2 VIEWS

[pelvis ap]
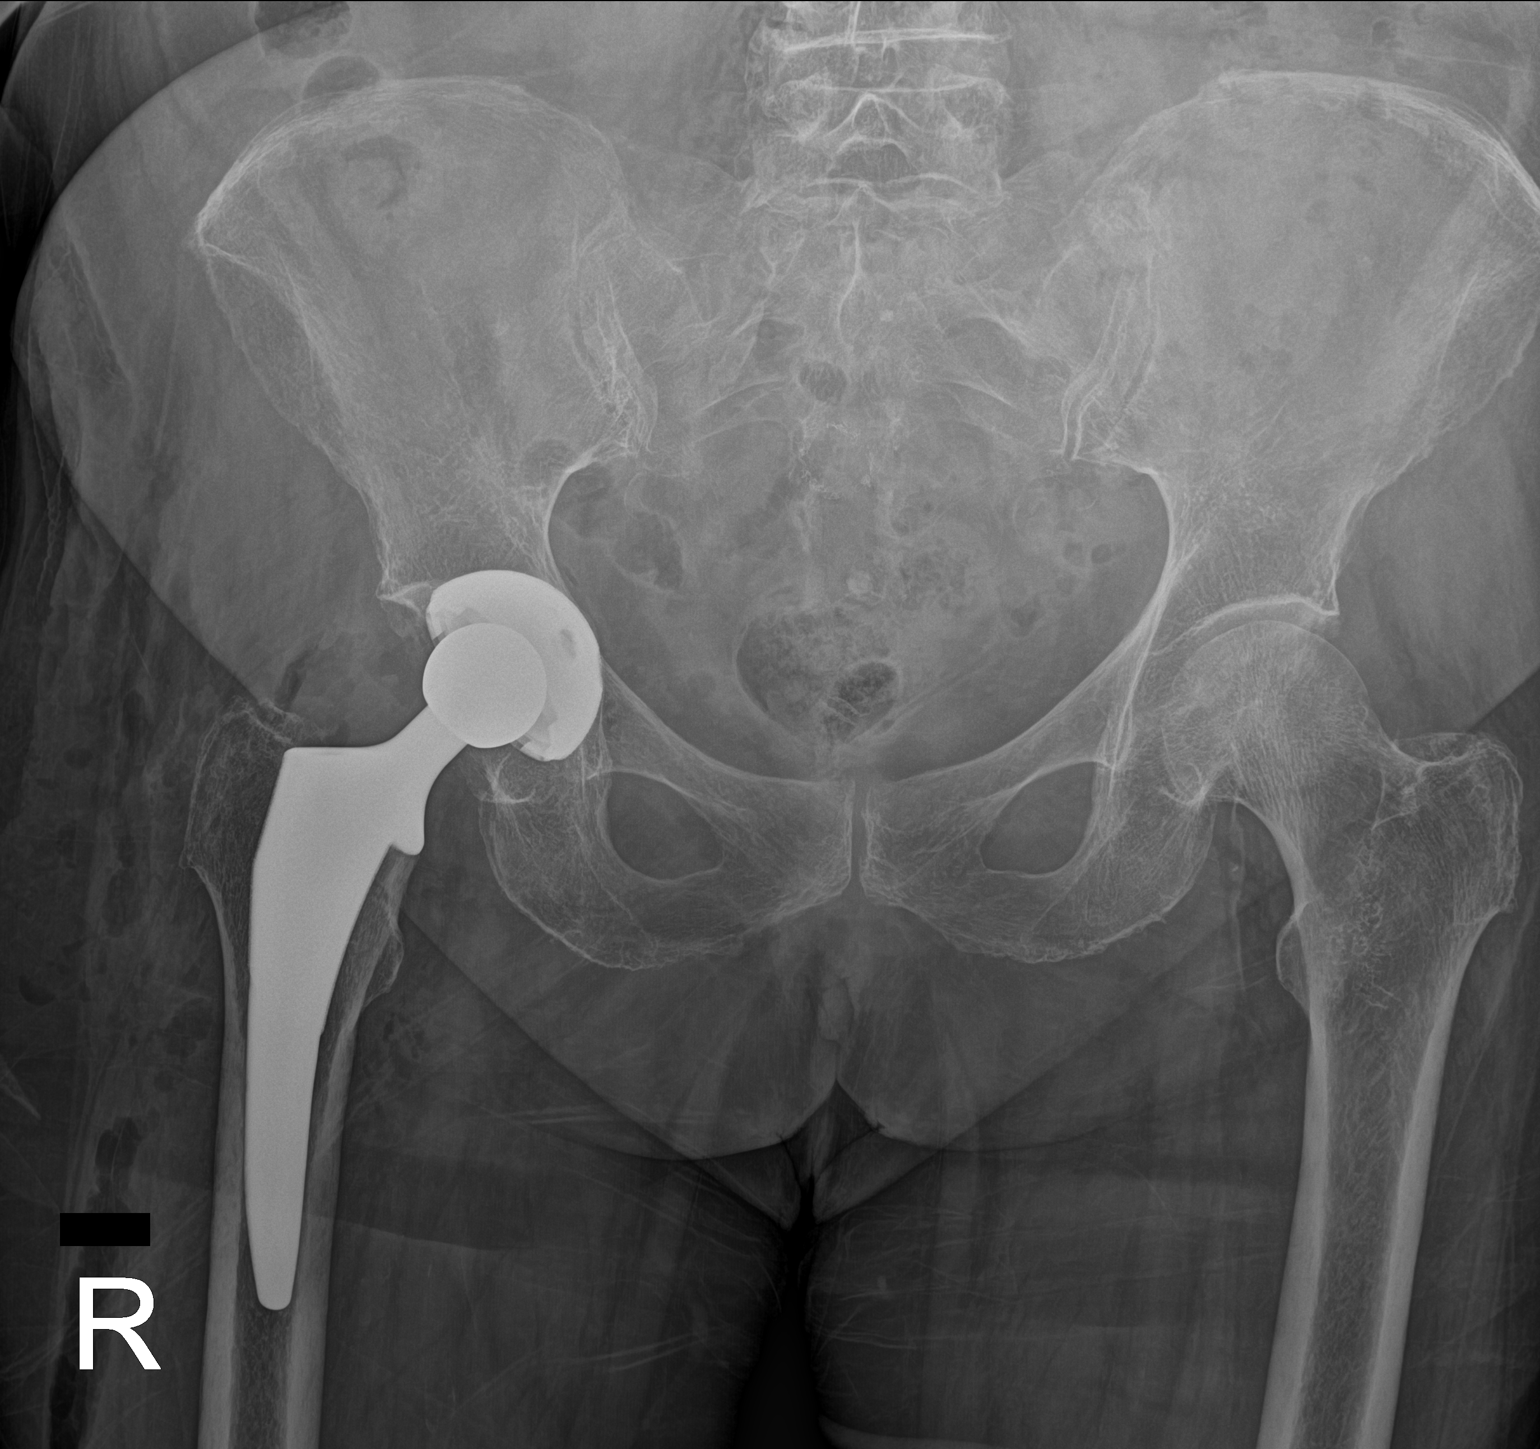

[1 of 1 positions shown; findings below may reference images not displayed]

FINDINGS: There is a total hip replacement on the right with prosthetic
components well-seated on frontal view. No fracture or dislocation.
Left hip joint appears normal.
IMPRESSION: Status post total hip replacement right with prosthetic components
well-seated on frontal view. No fracture or dislocation. Left hip
joint unremarkable in appearance.
